# Patient Record
Sex: Female | Born: 1990 | Race: White | Hispanic: Yes | Marital: Single | Smoking: Never smoker
Health system: Southern US, Community
[De-identification: ages and names within clinical notes are randomized; demographics above are authoritative.]

## PROBLEM LIST (undated history)

## (undated) DIAGNOSIS — Z789 Other specified health status: Secondary | ICD-10-CM

## (undated) HISTORY — DX: Other specified health status: Z78.9

## (undated) HISTORY — PX: NO PAST SURGERIES: SHX2092

---
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
## 2011-03-08 NOTE — Op Note (Signed)
NAMELINZEE, DEPAUL NO.:  [id number]   MEDICAL RECORD NO.:  [id number]          PATIENT TYPE:  AMB   LOCATION:  DSC                          FACILITY:  [facility]   PHYSICIAN:  Bunnie Pion, MD   DATE OF BIRTH:  08/21/1991   DATE OF PROCEDURE:  DATE OF DISCHARGE:                               OPERATIVE REPORT   PREOPERATIVE DIAGNOSIS:  Foreign body in posterior scalp.   POSTOPERATIVE DIAGNOSIS:  Foreign body in posterior scalp.   OPERATION PERFORMED:  Retrieval of foreign body in scalp with  fluoroscopic guidance.   ATTENDING SURGEON:  Cyd Silence, MD   ASSISTANT SURGEON:  Karie Soda, MD   ANESTHESIA:  General endotracheal.   ESTIMATED BLOOD LOSS:  Minimal.   FINDINGS:  1. Deeply embedded sewing needle in the periosteum of the posterior      scalp.  2. No evidence of infection.  3. No evidence of erosion into the bone.   DESCRIPTION OF PROCEDURE:  Patient was placed in the supine position  upon the operating room table.  When adequate level of anesthesia had  been safely obtained, the patient was rolled to a prone position,  carefully padded and secured.  Using the fluoroscopy machine, the  retained foreign body, which had been present for 7 or 8 years  supposedly, was localized.  The hair was shaved in this area for  approximately a half dollar size diameter.  This was prepped and draped.   Fluoroscopy was again used to pinpoint the area of concern.  A  transverse incision was made perpendicular to where the anticipated  foreign body was.  Fluoroscopy was used over the next several minutes to  further pinpoint the foreign body.  This was discovered deep within the  scalp adjacent to the bone.  There was no significant inflammation or  infection.  The needle was removed in two pieces and passed off the  field.   Repeat fluoroscopy demonstrated there was no retained foreign body.  The  incision was closed with deep interrupted nylon sutures.   Marcaine was  injected.  Patient was awakened in the operating room and returned to  recovery room in stable condition.      Bunnie Pion, MD  Electronically Signed     TMW/MEDQ  D:  06/11/2007  T:  06/11/2007  Job:  [id number]

## 2011-07-21 LAB — URINALYSIS, ROUTINE W REFLEX MICROSCOPIC
Bilirubin Urine: NEGATIVE
Hgb urine dipstick: NEGATIVE
Nitrite: NEGATIVE
Protein, ur: NEGATIVE
Specific Gravity, Urine: 1.02
Urobilinogen, UA: 0.2

## 2011-07-26 LAB — COMPREHENSIVE METABOLIC PANEL
AST: 20
Albumin: 2.7 — ABNORMAL LOW
BUN: 4 — ABNORMAL LOW
Calcium: 8.8
Creatinine, Ser: 0.48
Total Bilirubin: 0.6
Total Protein: 5.5 — ABNORMAL LOW

## 2011-07-26 LAB — URINALYSIS, DIPSTICK ONLY
Bilirubin Urine: NEGATIVE
Glucose, UA: NEGATIVE
Hgb urine dipstick: NEGATIVE
Ketones, ur: NEGATIVE
Nitrite: NEGATIVE
Specific Gravity, Urine: <1.005 — ABNORMAL LOW
pH: 7

## 2011-07-26 LAB — CBC
HCT: 25.9 — ABNORMAL LOW
Hemoglobin: 9 — ABNORMAL LOW
MCHC: 34.7
MCHC: 35.5
MCV: 92.8
Platelets: 250
RBC: 2.76 — ABNORMAL LOW
RBC: 3.11 — ABNORMAL LOW
RDW: 14.6

## 2011-07-26 LAB — RPR: RPR Ser Ql: NONREACTIVE

## 2011-08-05 LAB — POCT HEMOGLOBIN-HEMACUE: Hemoglobin: 14.1

## 2013-03-21 ENCOUNTER — Encounter (HOSPITAL_COMMUNITY): Payer: Self-pay | Admitting: *Deleted

## 2013-03-21 ENCOUNTER — Emergency Department (HOSPITAL_COMMUNITY)
Admission: EM | Admit: 2013-03-21 | Discharge: 2013-03-21 | Disposition: A | Discharge status: Home / Self Care | Payer: Self-pay | Attending: Emergency Medicine | Admitting: Emergency Medicine

## 2013-03-21 DIAGNOSIS — L03211 Cellulitis of face: Secondary | ICD-10-CM | POA: Insufficient documentation

## 2013-03-21 DIAGNOSIS — Z79899 Other long term (current) drug therapy: Secondary | ICD-10-CM | POA: Insufficient documentation

## 2013-03-21 DIAGNOSIS — L708 Other acne: Secondary | ICD-10-CM | POA: Insufficient documentation

## 2013-03-21 DIAGNOSIS — L709 Acne, unspecified: Secondary | ICD-10-CM

## 2013-03-21 DIAGNOSIS — Z872 Personal history of diseases of the skin and subcutaneous tissue: Secondary | ICD-10-CM | POA: Insufficient documentation

## 2013-03-21 DIAGNOSIS — L089 Local infection of the skin and subcutaneous tissue, unspecified: Secondary | ICD-10-CM

## 2013-03-21 DIAGNOSIS — R51 Headache: Secondary | ICD-10-CM | POA: Insufficient documentation

## 2013-03-21 DIAGNOSIS — L0201 Cutaneous abscess of face: Secondary | ICD-10-CM | POA: Insufficient documentation

## 2013-03-21 MED ORDER — CEPHALEXIN 250 MG PO CAPS: 500.0000 mg | ORAL_CAPSULE | Freq: Two times a day (BID) | ORAL | Status: DC

## 2013-03-21 NOTE — ED Provider Notes (Signed)
History  This chart was scribed for non-physician practitioner, Hector Shade, working with Dione Booze, MD by Ardeen Jourdain, ED Scribe. This patient was seen in room TR09C/TR09C and the patient's care was started at 2215.  CSN: [id number]  Arrival date & time 03/21/13  2000   First MD Initiated Contact with Patient 03/21/13 2215      Chief Complaint  Patient presents with  . Abscess     The history is provided by the patient. No language interpreter was used.    HPI Comments: Jeanette Becker is a 22 y.o. female who presents to the Emergency Department complaining of gradual onset, gradually worsening, constant abscesses to her face with associated pain. She states the areas began to worsen yesterday. She reports using Neosporin with out relief. She states she has a h/o similar abscesses to her left arm and face. She denies any fever, abdominal pain, eye pain, nausea and emesis as associated symptoms.    History reviewed. No pertinent past medical history.  History reviewed. No pertinent past surgical history.  History reviewed. No pertinent family history.  History  Substance Use Topics  . Smoking status: Never Smoker   . Smokeless tobacco: Not on file  . Alcohol Use: No    Review of Systems  Skin:       Abscesses to face  All other systems reviewed and are negative.    Allergies  Review of patient's allergies indicates no known allergies.  Home Medications   Current Outpatient Rx  Name  Route  Sig  Dispense  Refill  . Etonogestrel (IMPLANON [place])   Subcutaneous   Inject 1 application into the skin. Implanted May 2013         . cephALEXin (KEFLEX) 250 MG capsule   Oral   Take 2 capsules (500 mg total) by mouth 2 (two) times daily.   20 capsule   0     Triage Vitals: BP 114/64  Pulse 92  Temp(Src) 98.3 F (36.8 C)  Resp 18  SpO2 98%  Physical Exam  Nursing note and vitals reviewed. Constitutional: She is oriented to person, place, and  time. She appears well-developed and well-nourished. No distress.  HENT:  Head: Normocephalic and atraumatic.  Mouth/Throat: Oropharynx is clear and moist. No oropharyngeal exudate.  Eyes: EOM are normal. Pupils are equal, round, and reactive to light.  Neck: Normal range of motion. Neck supple. No tracheal deviation present.  Cardiovascular: Normal rate, regular rhythm and normal heart sounds.   Pulmonary/Chest: Effort normal and breath sounds normal. No respiratory distress.  Abdominal: Soft. She exhibits no distension.  Musculoskeletal: Normal range of motion. She exhibits no edema.  Neurological: She is alert and oriented to person, place, and time.  Skin: Skin is warm and dry. She is not diaphoretic.  Multiple pustular skin lesions over entire face. 6 circular erythematic draining lesions with white/yellow centers  on left and right side of face. Minimal clear drainage. Mild tenderness.   Psychiatric: She has a normal mood and affect. Her behavior is normal.    ED Course  Procedures (including critical care time)  DIAGNOSTIC STUDIES: Oxygen Saturation is 98% on room air, normal by my interpretation.    COORDINATION OF CARE:  10:35 PM-Discussed treatment plan which includes antibiotics and follow up with a dermatologist with pt at bedside and pt agreed to plan.    Labs Reviewed - No data to display No results found.   1. Acne   2. Skin infection  MDM  Pt has chronic hx of severe facial acne, presented with infected lesions on face.  Pt denies fever, nasal congestion, sore throat, or ear pain.  Lesions on face are draining scant clear fluid.  Believe pt needs further evaluation and treatment for chronic severe acne by a dermatologist.  Will attempt to treat current skin infection with a round of antibiotics.   Rx: Keflex.  F/u with [facility] urgent care or try making an appointment with [facility] next week for further treatment and evaluation for acne and skin  infection.  Return to ED if increased redness, pain, fever, n/v/d.   I personally performed the services described in this documentation, which was scribed in my presence. The recorded information has been reviewed and is accurate.    Junius Finner, PA-C 03/22/13 2054

## 2013-03-21 NOTE — ED Notes (Signed)
Pt has 3 abscesses on right side of face. Pt right side of face and neck swollen and tender to touch. Airway intact. No SOB.
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
## 2013-03-23 NOTE — ED Provider Notes (Signed)
Medical screening examination/treatment/procedure(s) were performed by non-physician practitioner and as supervising physician I was immediately available for consultation/collaboration.  Amor Packard, MD 03/23/13 0036 

## 2015-02-02 ENCOUNTER — Ambulatory Visit (INDEPENDENT_AMBULATORY_CARE_PROVIDER_SITE_OTHER): Payer: Self-pay | Admitting: Family Medicine

## 2015-02-02 VITALS — BP 102/68 | HR 67 | Temp 97.9°F | Resp 18 | Ht 61.5 in | Wt 167.0 lb

## 2015-02-02 DIAGNOSIS — N912 Amenorrhea, unspecified: Secondary | ICD-10-CM

## 2015-02-02 LAB — POCT URINE PREGNANCY: PREG TEST UR: NEGATIVE

## 2015-02-02 NOTE — Progress Notes (Signed)
This is a 24 year old woman who is worried that she might be pregnant. She passed some tissue one week ago. She's had no subsequent bleeding, fever, abdominal pain.  Patient had been using Nexplanon contraception until 3 months ago.  Objective: No acute distress  Patient showed me what tissue she passed last week which was in a regular room semisolid 1 cm piece of tissue  This chart was scribed in my presence and reviewed by me personally.    ICD-9-CM ICD-10-CM   1. Absent menses 626.0 N91.2 POCT urine pregnancy   It may have been an early pregnancy but I think it more likely that patient just passed some clotted menstrual tissue.  Signed, Elvina SidleKurt Torre Pikus, MD

## 2015-02-02 NOTE — Patient Instructions (Signed)
I want you to use Afrin nasal spray (oxymetazoline) twice a day for 3 days. We're going to refer you to a ear nose and throat doctor. I also want you to take omeprazole for one month for the reflux   Diverticulitis Diverticulitis is inflammation or infection of small pouches in your colon that form when you have a condition called diverticulosis. The pouches in your colon are called diverticula. Your colon, or large intestine, is where water is absorbed and stool is formed. Complications of diverticulitis can include:  Bleeding.  Severe infection.  Severe pain.  Perforation of your colon.  Obstruction of your colon. CAUSES  Diverticulitis is caused by bacteria. Diverticulitis happens when stool becomes trapped in diverticula. This allows bacteria to grow in the diverticula, which can lead to inflammation and infection. RISK FACTORS People with diverticulosis are at risk for diverticulitis. Eating a diet that does not include enough fiber from fruits and vegetables may make diverticulitis more likely to develop. SYMPTOMS  Symptoms of diverticulitis may include:  Abdominal pain and tenderness. The pain is normally located on the left side of the abdomen, but may occur in other areas.  Fever and chills.  Bloating.  Cramping.  Nausea.  Vomiting.  Constipation.  Diarrhea.  Blood in your stool. DIAGNOSIS  Your health care provider will ask you about your medical history and do a physical exam. You may need to have tests done because many medical conditions can cause the same symptoms as diverticulitis. Tests may include:  Blood tests.  Urine tests.  Imaging tests of the abdomen, including X-rays and CT scans. When your condition is under control, your health care provider may recommend that you have a colonoscopy. A colonoscopy can show how severe your diverticula are and whether something else is causing your symptoms. TREATMENT  Most cases of diverticulitis are mild  and can be treated at home. Treatment may include:  Taking over-the-counter pain medicines.  Following a clear liquid diet.  Taking antibiotic medicines by mouth for 7-10 days. More severe cases may be treated at a hospital. Treatment may include:  Not eating or drinking.  Taking prescription pain medicine.  Receiving antibiotic medicines through an IV tube.  Receiving fluids and nutrition through an IV tube.  Surgery. HOME CARE INSTRUCTIONS   Follow your health care provider's instructions carefully.  Follow a full liquid diet or other diet as directed by your health care provider. After your symptoms improve, your health care provider may tell you to change your diet. He or she may recommend you eat a high-fiber diet. Fruits and vegetables are good sources of fiber. Fiber makes it easier to pass stool.  Take fiber supplements or probiotics as directed by your health care provider.  Only take medicines as directed by your health care provider.  Keep all your follow-up appointments. SEEK MEDICAL CARE IF:   Your pain does not improve.  You have a hard time eating food.  Your bowel movements do not return to normal. SEEK IMMEDIATE MEDICAL CARE IF:   Your pain becomes worse.  Your symptoms do not get better.  Your symptoms suddenly get worse.  You have a fever.  You have repeated vomiting.  You have bloody or black, tarry stools. MAKE SURE YOU:   Understand these instructions.  Will watch your condition.  Will get help right away if you are not doing well or get worse. Document Released: 07/20/2005 Document Revised: 10/15/2013 Document Reviewed: 09/04/2013 [facility] Patient Information 2015 [place], [place]. This  information is not intended to replace advice given to you by your health care provider. Make sure you discuss any questions you have with your health care provider.  

## 2016-10-23 ENCOUNTER — Ambulatory Visit (HOSPITAL_COMMUNITY)
Admission: EM | Admit: 2016-10-23 | Discharge: 2016-10-23 | Disposition: A | Discharge status: Home / Self Care | Payer: No Typology Code available for payment source | Source: Ambulatory Visit | Attending: Emergency Medicine | Admitting: Emergency Medicine

## 2016-10-23 ENCOUNTER — Emergency Department (HOSPITAL_COMMUNITY)
Admission: EM | Admit: 2016-10-23 | Discharge: 2016-10-23 | Disposition: A | Discharge status: Home / Self Care | Payer: Self-pay | Attending: Emergency Medicine | Admitting: Emergency Medicine

## 2016-10-23 ENCOUNTER — Encounter (HOSPITAL_COMMUNITY): Payer: Self-pay | Admitting: Emergency Medicine

## 2016-10-23 DIAGNOSIS — Z0441 Encounter for examination and observation following alleged adult rape: Secondary | ICD-10-CM | POA: Insufficient documentation

## 2016-10-23 DIAGNOSIS — T7491XA Unspecified adult maltreatment, confirmed, initial encounter: Secondary | ICD-10-CM | POA: Insufficient documentation

## 2016-10-23 DIAGNOSIS — Z79899 Other long term (current) drug therapy: Secondary | ICD-10-CM | POA: Insufficient documentation

## 2016-10-23 DIAGNOSIS — T7421XA Adult sexual abuse, confirmed, initial encounter: Secondary | ICD-10-CM
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
## 2016-10-23 NOTE — ED Notes (Signed)
Pt discharged with SANE RN Amy
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
## 2018-01-04 DIAGNOSIS — R87612 Low grade squamous intraepithelial lesion on cytologic smear of cervix (LGSIL): Secondary | ICD-10-CM | POA: Insufficient documentation

## 2019-10-25 NOTE — L&D Delivery Note (Signed)
Delivery Note At 1:49 AM a viable and healthy female was delivered via Vaginal, Spontaneous (Presentation: Right Occiput Anterior).  APGAR: 8, 9; weight pending.   Placenta status: Spontaneous, Intact.  Cord: 3 vessels with the following complications: None.    Anesthesia: Epidural Episiotomy: None Lacerations: None Suture Repair: n/a Est. Blood Loss (mL): 50  Mom to postpartum.  Baby to Couplet care / Skin to Skin.  Jeanette Becker is a 29 y.o. female G2P1001 with IUP at [redacted]w[redacted]d admitted for SROM .  She progressed with augmentation to complete and pushed less than 10 minutes to deliver.  Cord clamping delayed by 1-3 minutes then clamped by CNM and cut by FOB.  Placenta intact and spontaneous, bleeding minimal.  Intact perineum with no repair.  Mom and baby stable prior to transfer to postpartum. She plans on breastfeeding. She requests contraceptive patch/POPs intially  for birth control.    [name] 06/28/2020, 2:49 AM

## 2019-12-09 ENCOUNTER — Ambulatory Visit: Payer: Medicaid Other

## 2019-12-09 DIAGNOSIS — Z3480 Encounter for supervision of other normal pregnancy, unspecified trimester: Secondary | ICD-10-CM | POA: Insufficient documentation

## 2019-12-09 MED ORDER — BLOOD PRESSURE KIT DEVI: 1.0000 | 0 refills | Status: DC | PRN

## 2019-12-09 NOTE — Progress Notes (Signed)
Patient seen and assessed by nursing staff during this encounter. I have reviewed the chart and agree with the documentation and plan.  Catalina Antigua, MD 12/09/2019 4:11 PM

## 2019-12-09 NOTE — Progress Notes (Signed)
..    Virtual Visit via Telephone Note  I connected with Jeanette Becker on 12/09/19 at 10:30 AM EST by telephone and verified that I am speaking with the correct person using two identifiers.  Location: Patient: Jeanette Becker   I discussed the limitations, risks, security and privacy concerns of performing an evaluation and management service by telephone and the availability of in person appointments. I also discussed with the patient that there may be a patient responsible charge related to this service. The patient expressed understanding and agreed to proceed.   History of Present Illness: PRENATAL INTAKE SUMMARY  Ms. Jeanette Becker presents today New OB Nurse Interview.  OB History    Gravida  2   Para  1   Term  1   Preterm      AB      Living  1     SAB      TAB      Ectopic      Multiple      Live Births  1          I have reviewed the patient's medical, obstetrical, social, and family histories, medications, and available lab results.  SUBJECTIVE She has no unusual complaints   Observations/Objective: Initial nurse interview for history/labs (New OB)  EDD: 07-03-20  GA: [redacted]w[redacted]d GP: G2P1  GENERAL APPEARANCE: alert, well appearing  Assessment and Plan: Normal pregnancy Pt states that 2 years ago she had a Mirena IUD and was sent to get U/S because strings were not seen. Pt never went because she could not afford the U/S, pt does not know status of IUD today. Advised that issue will be evaluated and discussed at appt next week, pt agreed. [facility].  Follow Up Instructions:   I discussed the assessment and treatment plan with the patient. The patient was provided an opportunity to ask questions and all were answered. The patient agreed with the plan and demonstrated an understanding of the instructions.   The patient was advised to call back or seek an in-person evaluation if the symptoms worsen or if the  condition fails to improve as anticipated.  I provided 15 minutes of non-face-to-face time during this encounter.   Katrina Stack, RN

## 2019-12-16 ENCOUNTER — Ambulatory Visit (INDEPENDENT_AMBULATORY_CARE_PROVIDER_SITE_OTHER): Payer: Medicaid Other | Admitting: Nurse Practitioner

## 2019-12-16 ENCOUNTER — Other Ambulatory Visit: Payer: Self-pay

## 2019-12-16 ENCOUNTER — Encounter: Payer: Self-pay | Admitting: Nurse Practitioner

## 2019-12-16 ENCOUNTER — Other Ambulatory Visit (HOSPITAL_COMMUNITY)
Admission: RE | Admit: 2019-12-16 | Discharge: 2019-12-16 | Disposition: A | Discharge status: Home / Self Care | Payer: Medicaid Other | Source: Ambulatory Visit | Attending: Nurse Practitioner | Admitting: Nurse Practitioner

## 2019-12-16 VITALS — Wt 149.0 lb

## 2019-12-16 DIAGNOSIS — Z3480 Encounter for supervision of other normal pregnancy, unspecified trimester: Secondary | ICD-10-CM

## 2019-12-16 DIAGNOSIS — R87612 Low grade squamous intraepithelial lesion on cytologic smear of cervix (LGSIL): Secondary | ICD-10-CM

## 2019-12-16 DIAGNOSIS — T8331XA Breakdown (mechanical) of intrauterine contraceptive device, initial encounter: Secondary | ICD-10-CM

## 2019-12-16 DIAGNOSIS — Z3A11 11 weeks gestation of pregnancy: Secondary | ICD-10-CM

## 2019-12-16 DIAGNOSIS — O2631 Retained intrauterine contraceptive device in pregnancy, first trimester: Secondary | ICD-10-CM

## 2019-12-16 DIAGNOSIS — Z3481 Encounter for supervision of other normal pregnancy, first trimester: Secondary | ICD-10-CM | POA: Diagnosis not present

## 2019-12-16 MED ORDER — VITAFOL GUMMIES 3.33-0.333-34.8 MG PO CHEW: 3.0000 | CHEWABLE_TABLET | Freq: Every day | ORAL | 11 refills | Status: DC

## 2019-12-16 NOTE — Progress Notes (Signed)
Subjective:   Jeanette Becker is a 29 y.o. G2P1001 at 23w3dby LMP being seen today for her first obstetrical visit.  Her obstetrical history is significant for previous IUD strings not seen and unsure if it is still in uterus - never had [place] check location of IUD. Patient does intend to breast feed. Pregnancy history fully reviewed.  Patient reports no complaints.  HISTORY: OB History  Gravida Para Term Preterm AB Living  _0 0 0 1  SAB TAB Ectopic Multiple Live Births  0 0 0 0 1    # Outcome Date GA Lbr Len/2nd Weight Sex Delivery Anes PTL Lv  2 Current           1 Term 09/19/08     Vag-Spont   LIV   History reviewed. No pertinent past medical history. History reviewed. No pertinent surgical history. History reviewed. No pertinent family history. Social History   Tobacco Use  . Smoking status: Never Smoker  . Smokeless tobacco: Never Used  Substance Use Topics  . Alcohol use: No  . Drug use: No   No Known Allergies Current Outpatient Medications on File Prior to Visit  Medication Sig Dispense Refill  . Blood Pressure Monitoring (BLOOD PRESSURE KIT) DEVI 1 kit by Does not apply route as needed. 1 each 0  . Prenatal Vit-Fe Fumarate-FA (PRENATAL MULTIVITAMIN) TABS tablet Take 1 tablet by mouth daily at 12 noon.    .Marland Kitchenlevonorgestrel (MIRENA) 20 MCG/24HR IUD by Intrauterine route.    . Multiple Vitamin (MULTIVITAMIN) tablet Take 1 tablet by mouth daily.     No current facility-administered medications on file prior to visit.     Exam   Vitals:   12/16/19 1430  Weight: 149 lb (67.6 kg)   Fetal Heart Rate (bpm): 156  Uterus:  Fundal Height: 11 cm  Pelvic Exam: Perineum: no hemorrhoids, normal perineum   Vulva: normal external genitalia, no lesions   Vagina:  normal mucosa, normal discharge   Cervix: no lesions and normal, pap smear not done, no IUD strings seen, looked carefully in cervical os - shadow but no IUD strings, very small amount of bleeding  from external os due to  manipulation of cervix    Adnexa: Bimanual omitted   Bony Pelvis: average  System: General: well-developed, well-nourished female in no acute distress   Breast:  deferred   Skin: normal coloration and turgor, no rashes   Neurologic: oriented, normal, negative, normal mood   Extremities: normal strength, tone, and muscle mass, ROM of all joints is normal   HEENT extraocular movement intact and sclera clear, anicteric   Mouth/Teeth deferred   Neck supple and no masses, normal thyroid   Cardiovascular: regular rate and rhythm   Respiratory:  no respiratory distress, normal breath sounds   Abdomen: soft, non-tender; no masses,  no organomegaly     Assessment:   Pregnancy: G2P1001 Patient Active Problem List   Diagnosis Date Noted  . Supervision of other normal pregnancy, antepartum 12/09/2019  . LGSIL on Pap smear of cervix 01/04/2018     Plan:  1. Supervision of other normal pregnancy, antepartum Reports [facility] and ROI completed to obtain pap smear result Discussed BabyScripts and MyChart Advised online childbirth and breastfeeding classes as she does not want epidural anesthesia for this birth.  - Obstetric Panel, Including HIV - Culture, OB Urine - Genetic Screening - Urine cytology ancillary only([place]) - Enroll Patient in  Babyscripts - Babyscripts Schedule Optimization - US OB Comp Less 14 Wks; Future  2. IUD pregnancy No strings seen, client was offered an ultrasound previously to check location of IUD but did not have US done.  Will get early US at first available appointment.  3. LGSIL on Pap smear of cervix Earlier pap result.  Reports she had no phone call about the results of her last pap which would indicate a normal result but will request copy of pap result.   Initial labs drawn. Continue prenatal vitamins. Genetic Screening discussed, NIPS: ordered. Ultrasound discussed; fetal anatomic survey:  discussed by not ordered.  will order early US to see if IUD is in uterus. Problem list reviewed and updated. The nature of [facility] with multiple MDs and other Advanced Practice Providers was explained to patient; also emphasized that residents, students are part of our team. Routine obstetric precautions reviewed. Return in about 4 weeks (around 01/13/2020) for virtual ROB.  Total face-to-face time with patient: 40 minutes.  Over 50% of encounter was spent on counseling and coordination of care.     TERRI BURLESON, FNP Family Nurse Practitioner, [facility]  [facility] 12/16/2019 8:31 PM   

## 2019-12-16 NOTE — Progress Notes (Signed)
Pt is here for initial OB visit, LMP 09/27/19, EDD 07/03/20. Pt reports last pap was in September at another office, pt thinks it was normal.

## 2019-12-17 LAB — OBSTETRIC PANEL, INCLUDING HIV
Antibody Screen: NEGATIVE
Basophils Absolute: 0.1 10*3/uL (ref 0.0–0.2)
Basos: 1 %
EOS (ABSOLUTE): 0.3 10*3/uL (ref 0.0–0.4)
Eos: 4 %
HIV Screen 4th Generation wRfx: NONREACTIVE
Hematocrit: 38.3 % (ref 34.0–46.6)
Hemoglobin: 13 g/dL (ref 11.1–15.9)
Hepatitis B Surface Ag: NEGATIVE
Immature Grans (Abs): 0.1 10*3/uL (ref 0.0–0.1)
Immature Granulocytes: 1 %
Lymphocytes Absolute: 1.8 10*3/uL (ref 0.7–3.1)
Lymphs: 22 %
MCH: 31.2 pg (ref 26.6–33.0)
MCHC: 33.9 g/dL (ref 31.5–35.7)
MCV: 92 fL (ref 79–97)
Monocytes Absolute: 0.4 10*3/uL (ref 0.1–0.9)
Monocytes: 6 %
Neutrophils Absolute: 5.3 10*3/uL (ref 1.4–7.0)
Neutrophils: 66 %
Platelets: 392 10*3/uL (ref 150–450)
RBC: 4.17 x10E6/uL (ref 3.77–5.28)
RDW: 12 % (ref 11.7–15.4)
RPR Ser Ql: NONREACTIVE
Rh Factor: POSITIVE
Rubella Antibodies, IGG: 3.07 index (ref >0.99)
WBC: 8 10*3/uL (ref 3.4–10.8)

## 2019-12-18 LAB — URINE CYTOLOGY ANCILLARY ONLY
Chlamydia: POSITIVE — AB
Comment: NEGATIVE
Comment: NORMAL
Neisseria Gonorrhea: NEGATIVE

## 2019-12-19 ENCOUNTER — Telehealth: Payer: Medicaid Other | Admitting: Obstetrics & Gynecology

## 2019-12-19 ENCOUNTER — Ambulatory Visit (HOSPITAL_COMMUNITY)
Admission: RE | Admit: 2019-12-19 | Discharge: 2019-12-19 | Disposition: A | Discharge status: Home / Self Care | Payer: Medicaid Other | Source: Ambulatory Visit | Attending: Nurse Practitioner | Admitting: Nurse Practitioner

## 2019-12-19 ENCOUNTER — Other Ambulatory Visit: Payer: Self-pay

## 2019-12-19 DIAGNOSIS — Z3480 Encounter for supervision of other normal pregnancy, unspecified trimester: Secondary | ICD-10-CM | POA: Diagnosis present

## 2019-12-19 LAB — CULTURE, OB URINE

## 2019-12-19 LAB — URINE CULTURE, OB REFLEX

## 2019-12-20 ENCOUNTER — Other Ambulatory Visit: Payer: Self-pay

## 2019-12-20 MED ORDER — AZITHROMYCIN 500 MG PO TABS: 1000.0000 mg | ORAL_TABLET | Freq: Once | ORAL | 1 refills | Status: AC

## 2019-12-20 NOTE — Progress Notes (Signed)
Notified pt of pos. Chlamydia result and sent rx for treatment to her preferred pharmacy with one refill for partner per protocol. Advised pt to refrain from IC for one week after both parties have been treated. Advised pt we will retest her to make sure infection is gone. Pt voices understanding. Health Dept notification faxed.

## 2019-12-24 ENCOUNTER — Encounter: Payer: Self-pay | Admitting: Nurse Practitioner

## 2019-12-27 ENCOUNTER — Encounter: Payer: Self-pay | Admitting: Nurse Practitioner

## 2020-01-07 ENCOUNTER — Other Ambulatory Visit: Payer: Self-pay

## 2020-01-07 ENCOUNTER — Telehealth: Payer: Self-pay

## 2020-01-07 DIAGNOSIS — O219 Vomiting of pregnancy, unspecified: Secondary | ICD-10-CM

## 2020-01-07 MED ORDER — PROMETHAZINE HCL 25 MG PO TABS: 25.0000 mg | ORAL_TABLET | Freq: Four times a day (QID) | ORAL | 0 refills | Status: DC | PRN

## 2020-01-07 NOTE — Progress Notes (Signed)
Rx sent per protocol For Nausea message will be sent to provider regarding HA's.

## 2020-01-07 NOTE — Telephone Encounter (Signed)
Return call to pt  Pt not ava  LVOM

## 2020-01-13 ENCOUNTER — Telehealth (INDEPENDENT_AMBULATORY_CARE_PROVIDER_SITE_OTHER): Payer: Medicaid Other | Admitting: Advanced Practice Midwife

## 2020-01-13 VITALS — BP 99/53 | HR 88

## 2020-01-13 DIAGNOSIS — O98811 Other maternal infectious and parasitic diseases complicating pregnancy, first trimester: Secondary | ICD-10-CM | POA: Diagnosis not present

## 2020-01-13 DIAGNOSIS — R519 Headache, unspecified: Secondary | ICD-10-CM

## 2020-01-13 DIAGNOSIS — A749 Chlamydial infection, unspecified: Secondary | ICD-10-CM

## 2020-01-13 DIAGNOSIS — O26892 Other specified pregnancy related conditions, second trimester: Secondary | ICD-10-CM | POA: Diagnosis not present

## 2020-01-13 DIAGNOSIS — Z3A19 19 weeks gestation of pregnancy: Secondary | ICD-10-CM

## 2020-01-13 MED ORDER — BUTALBITAL-APAP-CAFFEINE 50-325-40 MG PO TABS: 1.0000 | ORAL_TABLET | Freq: Four times a day (QID) | ORAL | 0 refills | Status: DC | PRN

## 2020-01-13 NOTE — Progress Notes (Signed)
   TELEHEALTH VIRTUAL OBSTETRICS VISIT ENCOUNTER NOTE  I connected with Jeanette Becker on 01/13/20 at 10:55 AM EDT by telephone at home and verified that I am speaking with the correct person using two identifiers.   I discussed the limitations, risks, security and privacy concerns of performing an evaluation and management service by telephone and the availability of in person appointments. I also discussed with the patient that there may be a patient responsible charge related to this service. The patient expressed understanding and agreed to proceed.  Subjective:  Jeanette Becker is a 29 y.o. G2P1001 at [redacted]w[redacted]d being followed for ongoing prenatal care.  She is currently monitored for the following issues for this low-risk pregnancy and has Supervision of other normal pregnancy, antepartum and LGSIL on Pap smear of cervix on their problem list.  Patient reports headache. Reports fetal movement. Denies any contractions, bleeding or leaking of fluid.   The following portions of the patient's history were reviewed and updated as appropriate: allergies, current medications, past family history, past medical history, past social history, past surgical history and problem list.   Objective:   General:  Alert, oriented and cooperative.   Mental Status: Normal mood and affect perceived. Normal judgment and thought content.  Rest of physical exam deferred due to type of encounter  Assessment and Plan:  Pregnancy: G2P1001 at [redacted]w[redacted]d 1. Supervision of other normal pregnancy, antepartum --Pt  denies cramping, LOF, or vaginal bleeding --Needs AFP and TOC chlamydia, offered lab only visit vs in person visit in 2 weeks --In person visit in 2 weeks, the virtual in 6 weeks  - AFP, Serum, Open Spina Bifida; Future - [place] MFM OB COMP + 14 WK; Future  2. Chlamydia infection affecting pregnancy in first trimester --Treated for chlamydia 2/26.  Needs TOC at next visit. - Cervicovaginal ancillary  only( [place]); Future  3. Headache in pregnancy, antepartum, second trimester --Daily h/a, improved but not resolved by Tylenol. --Pt drinking 64-80 oz water daily. Reports some n/v so may not be getting enough calories. --Encouraged increased calories, especially protiein.  --Try Fioricet PRN if not improved.  --F/U in 2 weeks - butalbital-acetaminophen-caffeine (FIORICET) 50-325-40 MG tablet; Take 1-2 tablets by mouth every 6 (six) hours as needed for headache.  Dispense: 20 tablet; Refill: 0  Preterm labor symptoms and general obstetric precautions including but not limited to vaginal bleeding, contractions, leaking of fluid and fetal movement were reviewed in detail with the patient.  I discussed the assessment and treatment plan with the patient. The patient was provided an opportunity to ask questions and all were answered. The patient agreed with the plan and demonstrated an understanding of the instructions. The patient was advised to call back or seek an in-person office evaluation/go to MAU at [facility] for any urgent or concerning symptoms. Please refer to After Visit Summary for other counseling recommendations.   I provided 10 minutes of non-face-to-face time during this encounter.  No follow-ups on file.  No future appointments.  [name], CNM [facility] [employer], [facility]

## 2020-01-13 NOTE — Progress Notes (Signed)
S/w pt for virtual visit, not feeling fetal movement yet, denies pain.

## 2020-01-27 ENCOUNTER — Other Ambulatory Visit: Payer: Self-pay

## 2020-01-27 ENCOUNTER — Other Ambulatory Visit (HOSPITAL_COMMUNITY)
Admission: RE | Admit: 2020-01-27 | Discharge: 2020-01-27 | Disposition: A | Discharge status: Home / Self Care | Payer: Medicaid Other | Source: Ambulatory Visit | Attending: Advanced Practice Midwife | Admitting: Advanced Practice Midwife

## 2020-01-27 ENCOUNTER — Ambulatory Visit (INDEPENDENT_AMBULATORY_CARE_PROVIDER_SITE_OTHER): Payer: Medicaid Other | Admitting: Advanced Practice Midwife

## 2020-01-27 VITALS — BP 100/61 | HR 65 | Wt 148.8 lb

## 2020-01-27 DIAGNOSIS — O98811 Other maternal infectious and parasitic diseases complicating pregnancy, first trimester: Secondary | ICD-10-CM | POA: Insufficient documentation

## 2020-01-27 DIAGNOSIS — Z3480 Encounter for supervision of other normal pregnancy, unspecified trimester: Secondary | ICD-10-CM

## 2020-01-27 DIAGNOSIS — R12 Heartburn: Secondary | ICD-10-CM

## 2020-01-27 DIAGNOSIS — O26892 Other specified pregnancy related conditions, second trimester: Secondary | ICD-10-CM

## 2020-01-27 DIAGNOSIS — Z3A17 17 weeks gestation of pregnancy: Secondary | ICD-10-CM

## 2020-01-27 DIAGNOSIS — O98312 Other infections with a predominantly sexual mode of transmission complicating pregnancy, second trimester: Secondary | ICD-10-CM

## 2020-01-27 DIAGNOSIS — A749 Chlamydial infection, unspecified: Secondary | ICD-10-CM | POA: Insufficient documentation

## 2020-01-27 MED ORDER — FAMOTIDINE 20 MG PO TABS: 20.0000 mg | ORAL_TABLET | Freq: Two times a day (BID) | ORAL | 2 refills | Status: DC

## 2020-01-27 NOTE — Patient Instructions (Signed)
Heartburn During Pregnancy Heartburn is a type of pain or discomfort that can happen in the throat or chest. It is often described as a burning sensation. Heartburn is common during pregnancy because:  A hormone (progesterone) that is released during pregnancy may relax the valve (lower esophageal sphincter, or LES) that separates the esophagus from the stomach. This allows stomach acid to move up into the esophagus, causing heartburn.  The uterus gets larger and pushes up on the stomach, which pushes more acid into the esophagus. This is especially true in the later stages of pregnancy. Heartburn usually goes away or gets better after giving birth. What are the causes? Heartburn is caused by stomach acid backing up into the esophagus (reflux). Reflux can be triggered by:  Changing hormone levels.  Large meals.  Certain foods and beverages, such as coffee, chocolate, onions, and peppermint.  Exercise.  Increased stomach acid production. What increases the risk? You are more likely to experience heartburn during pregnancy if you:  Had heartburn prior to becoming pregnant.  Have been pregnant more than once before.  Are overweight or obese. The likelihood that you will get heartburn also increases as you get farther along in your pregnancy, especially during the last trimester. What are the signs or symptoms? Symptoms of this condition include:  Burning pain in the chest or lower throat.  Bitter taste in the mouth.  Coughing.  Problems swallowing.  Vomiting.  Hoarse voice.  Asthma. Symptoms may get worse when you lie down or bend over. Symptoms are often worse at night. How is this diagnosed? This condition is diagnosed based on:  Your medical history.  Your symptoms.  Blood tests to check for a certain type of bacteria associated with heartburn.  Whether taking heartburn medicine relieves your symptoms.  Examination of the stomach and esophagus using a tube with  a light and camera on the end (endoscopy). How is this treated? Treatment varies depending on how severe your symptoms are. Your health care provider may recommend:  Over-the-counter medicines (antacids or acid reducers) for mild heartburn.  Prescription medicines to decrease stomach acid or to protect your stomach lining.  Certain changes in your diet.  Raising the head of your bed so it is higher than the foot of the bed. This helps prevent stomach acid from backing up into the esophagus when you are lying down. Follow these instructions at home: Eating and drinking  Do not drink alcohol during your pregnancy.  Identify foods and beverages that make your symptoms worse, and avoid them.  Beverages that you may want to avoid include: ? Coffee and tea (with or without caffeine). ? Energy drinks and sports drinks. ? Carbonated drinks or sodas. ? Citrus fruit juices.  Foods that you may want to avoid include: ? Chocolate and cocoa. ? Peppermint and mint flavorings. ? Garlic, onions, and horseradish. ? Spicy and acidic foods, including peppers, chili powder, curry powder, vinegar, hot sauces, and barbecue sauce. ? Citrus fruits, such as oranges, lemons, and limes. ? Tomato-based foods, such as red sauce, chili, and salsa. ? Fried and fatty foods, such as donuts, french fries, potato chips, and high-fat dressings. ? High-fat meats, such as hot dogs, cold cuts, sausage, ham, and bacon. ? High-fat dairy items, such as whole milk, butter, and cheese.  Eat small, frequent meals instead of large meals.  Avoid drinking large amounts of liquid with your meals.  Avoid eating meals during the 2-3 hours before bedtime.  Avoid lying down right   after you eat.  Do not exercise right after you eat. Medicines  Take over-the-counter and prescription medicines only as told by your health care provider.  Do not take aspirin, ibuprofen, or other NSAIDs unless your health care provider tells  you to do that.  You may be instructed to avoid medicines that contain sodium bicarbonate. General instructions   If directed, raise the head of your bed about 6 inches (15 cm) by putting blocks under the legs. Sleeping with more pillows does not effectively relieve heartburn because it only changes the position of your head.  Do not use any products that contain nicotine or tobacco, such as cigarettes and e-cigarettes. If you need help quitting, ask your health care provider.  Wear loose-fitting clothing.  Try to reduce your stress, such as with yoga or meditation. If you need help managing stress, ask your health care provider.  Maintain a healthy weight. If you are overweight, work with your health care provider to safely lose weight.  Keep all follow-up visits as told by your health care provider. This is important. Contact a health care provider if:  You develop new symptoms.  Your symptoms do not improve with treatment.  You have unexplained weight loss.  You have difficulty swallowing.  You make loud sounds when you breathe (wheeze).  You have a cough that does not go away.  You have frequent heartburn for more than 2 weeks.  You have nausea or vomiting that does not get better with treatment.  You have pain in your abdomen. Get help right away if:  You have severe chest pain that spreads to your arm, neck, or jaw.  You feel sweaty, dizzy, or light-headed.  You have shortness of breath.  You have pain when swallowing.  You vomit, and your vomit looks like blood or coffee grounds.  Your stool is bloody or black. This information is not intended to replace advice given to you by your health care provider. Make sure you discuss any questions you have with your health care provider. Document Revised: 01/08/2018 Document Reviewed: 06/27/2016 Elsevier Patient Education  2020 Elsevier Inc.  

## 2020-01-27 NOTE — Progress Notes (Signed)
Patient reports feeling light flutters, denies pain.

## 2020-01-27 NOTE — Progress Notes (Signed)
   PRENATAL VISIT NOTE  Subjective:  Jeanette Becker is a 29 y.o. G2P1001 at [redacted]w[redacted]d being seen today for ongoing prenatal care.  She is currently monitored for the following issues for this low-risk pregnancy and has Supervision of other normal pregnancy, antepartum; LGSIL on Pap smear of cervix; and Chlamydia infection affecting pregnancy in first trimester on their problem list.  Patient reports heartburn and nausea.  Contractions: Not present. Vag. Bleeding: None.  Movement: Present. Denies leaking of fluid.   The following portions of the patient's history were reviewed and updated as appropriate: allergies, current medications, past family history, past medical history, past social history, past surgical history and problem list.   Objective:   Vitals:   01/27/20 0856  BP: 100/61  Pulse: 65  Weight: 148 lb 12.8 oz (67.5 kg)    Fetal Status: Fetal Heart Rate (bpm): 143   Movement: Present     General:  Alert, oriented and cooperative. Patient is in no acute distress.  Skin: Skin is warm and dry. No rash noted.   Cardiovascular: Normal heart rate noted  Respiratory: Normal respiratory effort, no problems with respiration noted  Abdomen: Soft, gravid, appropriate for gestational age.  Pain/Pressure: Absent     Pelvic: Cervical exam deferred        Extremities: Normal range of motion.  Edema: None  Mental Status: Normal mood and affect. Normal behavior. Normal judgment and thought content.   Assessment and Plan:  Pregnancy: G2P1001 at [redacted]w[redacted]d 1. Supervision of other normal pregnancy, antepartum --Anticipatory guidance about next visits/weeks of pregnancy given. --Next visit in 4 weeks virtual  2. Chlamydia infection affecting pregnancy in first trimester --Tx 2/26, partner also treated  - AFP, Serum, Open Spina Bifida - Cervicovaginal ancillary only( [place])  3. Heartburn during pregnancy in second trimester --Sometimes mild burning but mostly bad taste in her mouth  causing nausea and low appetite. --Will try Pepcid BID initially but consider PPI like Protonix if not effective - famotidine (PEPCID) 20 MG tablet; Take 1 tablet (20 mg total) by mouth 2 (two) times daily.  Dispense: 60 tablet; Refill: 2  Preterm labor symptoms and general obstetric precautions including but not limited to vaginal bleeding, contractions, leaking of fluid and fetal movement were reviewed in detail with the patient. Please refer to After Visit Summary for other counseling recommendations.   No follow-ups on file.  Future Appointments  Date Time Provider Department Center  02/10/2020  1:30 PM [facility] [place] 1 [facility]    [name], CNM

## 2020-01-28 LAB — CERVICOVAGINAL ANCILLARY ONLY
Chlamydia: NEGATIVE
Comment: NEGATIVE
Comment: NORMAL
Neisseria Gonorrhea: NEGATIVE

## 2020-01-29 LAB — AFP, SERUM, OPEN SPINA BIFIDA
AFP MoM: 1.15
AFP Value: 45.4 ng/mL
Gest. Age on Collection Date: 17 weeks
Maternal Age At EDD: 28.5 yr
OSBR Risk 1 IN: 7603
Test Results:: NEGATIVE
Weight: 148 [lb_av]

## 2020-02-03 ENCOUNTER — Telehealth: Payer: Self-pay

## 2020-02-03 NOTE — Telephone Encounter (Signed)
Returned call and answered questions about fetal movement at 18 weeks.

## 2020-02-10 ENCOUNTER — Other Ambulatory Visit: Payer: Self-pay

## 2020-02-10 ENCOUNTER — Other Ambulatory Visit (HOSPITAL_COMMUNITY): Payer: Self-pay | Admitting: *Deleted

## 2020-02-10 ENCOUNTER — Ambulatory Visit (HOSPITAL_COMMUNITY)
Admission: RE | Admit: 2020-02-10 | Discharge: 2020-02-10 | Disposition: A | Discharge status: Home / Self Care | Payer: Medicaid Other | Source: Ambulatory Visit | Attending: Advanced Practice Midwife | Admitting: Advanced Practice Midwife

## 2020-02-10 DIAGNOSIS — Z362 Encounter for other antenatal screening follow-up: Secondary | ICD-10-CM

## 2020-02-10 DIAGNOSIS — Z3480 Encounter for supervision of other normal pregnancy, unspecified trimester: Secondary | ICD-10-CM | POA: Diagnosis not present

## 2020-02-10 DIAGNOSIS — Z3A19 19 weeks gestation of pregnancy: Secondary | ICD-10-CM | POA: Diagnosis present

## 2020-02-20 ENCOUNTER — Inpatient Hospital Stay (HOSPITAL_COMMUNITY)
Admission: AD | Admit: 2020-02-20 | Discharge: 2020-02-20 | Disposition: A | Discharge status: Home / Self Care | Payer: Medicaid Other | Attending: Obstetrics and Gynecology | Admitting: Obstetrics and Gynecology

## 2020-02-20 ENCOUNTER — Encounter (HOSPITAL_COMMUNITY): Payer: Self-pay | Admitting: Obstetrics and Gynecology

## 2020-02-20 ENCOUNTER — Other Ambulatory Visit: Payer: Self-pay

## 2020-02-20 DIAGNOSIS — Z3A20 20 weeks gestation of pregnancy: Secondary | ICD-10-CM | POA: Diagnosis not present

## 2020-02-20 DIAGNOSIS — R102 Pelvic and perineal pain: Secondary | ICD-10-CM | POA: Insufficient documentation

## 2020-02-20 DIAGNOSIS — R109 Unspecified abdominal pain: Secondary | ICD-10-CM

## 2020-02-20 DIAGNOSIS — O26892 Other specified pregnancy related conditions, second trimester: Secondary | ICD-10-CM | POA: Insufficient documentation

## 2020-02-20 DIAGNOSIS — R1030 Lower abdominal pain, unspecified: Secondary | ICD-10-CM | POA: Insufficient documentation

## 2020-02-20 DIAGNOSIS — O26899 Other specified pregnancy related conditions, unspecified trimester: Secondary | ICD-10-CM

## 2020-02-20 DIAGNOSIS — A749 Chlamydial infection, unspecified: Secondary | ICD-10-CM

## 2020-02-20 LAB — URINALYSIS, ROUTINE W REFLEX MICROSCOPIC
Bacteria, UA: NONE SEEN
Bilirubin Urine: NEGATIVE
Glucose, UA: NEGATIVE mg/dL
Hgb urine dipstick: NEGATIVE
Ketones, ur: NEGATIVE mg/dL
Nitrite: NEGATIVE
Protein, ur: NEGATIVE mg/dL
Specific Gravity, Urine: 1.019 (ref 1.005–1.030)
pH: 6 (ref 5.0–8.0)

## 2020-02-20 LAB — WET PREP, GENITAL
Sperm: NONE SEEN
Trich, Wet Prep: NONE SEEN
Yeast Wet Prep HPF POC: NONE SEEN

## 2020-02-20 MED ORDER — IBUPROFEN 800 MG PO TABS
400.0000 mg | ORAL_TABLET | Freq: Once | ORAL | Status: AC
  Administered 2020-02-20: 22:00:00 400 mg via ORAL
  Filled 2020-02-20: qty 1

## 2020-02-20 NOTE — MAU Provider Note (Signed)
Chief Complaint:  Abdominal Pain   First Provider Initiated Contact with Patient 02/20/20 2124     HPI: Jeanette Becker is a 29 y.o. G2P1001 at 84w6dho presents to maternity admissions reporting lower abdominal pain and pressure.  Feels tightening up and pressure.  Has been taking Azo Cranberry and feels it helped some. She denies LOF, vaginal bleeding, vaginal itching/burning, urinary symptoms, h/a, dizziness, n/v, diarrhea, constipation or fever/chills.    Abdominal Pain This is a new problem. The current episode started today. The problem occurs intermittently. The problem has been unchanged. The pain is located in the suprapubic region, LLQ and RLQ. The quality of the pain is cramping and dull. The abdominal pain does not radiate. Pertinent negatives include no constipation, diarrhea, dysuria, fever, frequency, myalgias, nausea or vomiting. Nothing aggravates the pain. The pain is relieved by nothing. Treatments tried: AZO cranberry. The treatment provided mild relief.    RN note: Jeanette Pinnixis a 29y.o. at 272w6dere in MAU reporting increased lower abdominal pain and pressure recently, in addition to discomfort post intercourse. Onset of complaint: today Pain score: 4/10   FHT:155 via doppler Lab orders placed from triage:  UA  Past Medical History: History reviewed. No pertinent past medical history.  Past obstetric history: OB History  Gravida Para Term Preterm AB Living  '2 1 1     1  '$ SAB TAB Ectopic Multiple Live Births          1    # Outcome Date GA Lbr Len/2nd Weight Sex Delivery Anes PTL Lv  2 Current           1 Term 09/19/08     Vag-Spont   LIV    Past Surgical History: No past surgical history on file.  Family History: No family history on file.  Social History: Social History   Tobacco Use  . Smoking status: Never Smoker  . Smokeless tobacco: Never Used  Substance Use Topics  . Alcohol use: No  . Drug use: No    Allergies: No Known  Allergies  Meds:  Medications Prior to Admission  Medication Sig Dispense Refill Last Dose  . Blood Pressure Monitoring (BLOOD PRESSURE KIT) DEVI 1 kit by Does not apply route as needed. 1 each 0   . butalbital-acetaminophen-caffeine (FIORICET) 50-325-40 MG tablet Take 1-2 tablets by mouth every 6 (six) hours as needed for headache. 20 tablet 0   . famotidine (PEPCID) 20 MG tablet Take 1 tablet (20 mg total) by mouth 2 (two) times daily. 60 tablet 2   . levonorgestrel (MIRENA) 20 MCG/24HR IUD by Intrauterine route.     . Multiple Vitamin (MULTIVITAMIN) tablet Take 1 tablet by mouth daily.     . Prenatal Vit-Fe Fumarate-FA (PRENATAL MULTIVITAMIN) TABS tablet Take 1 tablet by mouth daily at 12 noon.     . Prenatal Vit-Fe Phos-FA-Omega (VITAFOL GUMMIES) 3.33-0.333-34.8 MG CHEW Chew 3 each by mouth daily. 90 tablet 11   . promethazine (PHENERGAN) 25 MG tablet Take 1 tablet (25 mg total) by mouth every 6 (six) hours as needed for nausea or vomiting. 30 tablet 0     I have reviewed patient's Past Medical Hx, Surgical Hx, Family Hx, Social Hx, medications and allergies.   ROS:  Review of Systems  Constitutional: Negative for fever.  Gastrointestinal: Positive for abdominal pain. Negative for constipation, diarrhea, nausea and vomiting.  Genitourinary: Negative for dysuria and frequency.  Musculoskeletal: Negative for myalgias.   Other systems negative  Physical Exam   Patient Vitals for the past 24 hrs:  BP Temp Pulse Resp Weight  02/20/20 2111 (!) 112/56 98.4 F (36.9 C) 82 19 --  02/20/20 2102 -- -- -- -- 67.3 kg   Constitutional: Well-developed, well-nourished female in no acute distress.  Cardiovascular: normal rate and rhythm Respiratory: normal effort, clear to auscultation bilaterally GI: Abd soft, non-tender except over bilateral round ligaments, uterus is gravid, appropriate for gestational age.   No rebound or guarding. MS: Extremities nontender, no edema, normal  ROM Neurologic: Alert and oriented x 4.  GU: Neg CVAT.  PELVIC EXAM:  Dilation: Fingertip Effacement (%): Thick Station: Ballotable Exam by:: Hansel Feinstein, CNM   FHT:  155   Labs: Results for orders placed or performed during the hospital encounter of 02/20/20 (from the past 24 hour(s))  Urinalysis, Routine w reflex microscopic     Status: Abnormal   Collection Time: 02/20/20  9:13 PM  Result Value Ref Range   Color, Urine YELLOW YELLOW   APPearance HAZY (A) CLEAR   Specific Gravity, Urine 1.019 1.005 - 1.030   pH 6.0 5.0 - 8.0   Glucose, UA NEGATIVE NEGATIVE mg/dL   Hgb urine dipstick NEGATIVE NEGATIVE   Bilirubin Urine NEGATIVE NEGATIVE   Ketones, ur NEGATIVE NEGATIVE mg/dL   Protein, ur NEGATIVE NEGATIVE mg/dL   Nitrite NEGATIVE NEGATIVE   Leukocytes,Ua SMALL (A) NEGATIVE   RBC / HPF 0-5 0 - 5 RBC/hpf   WBC, UA 6-10 0 - 5 WBC/hpf   Bacteria, UA NONE SEEN NONE SEEN   Squamous Epithelial / LPF 0-5 0 - 5   Mucus PRESENT   Wet prep, genital     Status: Abnormal   Collection Time: 02/20/20 10:15 PM   Specimen: Vaginal  Result Value Ref Range   Yeast Wet Prep HPF POC NONE SEEN NONE SEEN   Trich, Wet Prep NONE SEEN NONE SEEN   Clue Cells Wet Prep HPF POC PRESENT (A) NONE SEEN   WBC, Wet Prep HPF POC FEW (A) NONE SEEN   Sperm NONE SEEN     O/Positive/-- (02/22 1520)  Imaging:    MAU Course/MDM: I have ordered labs and reviewed results. UA is clear.  Suspect the pain was either round ligament pain or cramping of uterus with growth.  PTL precautions since cervix was fingertip and long (possibly due to being a multip cx)   Treatments in MAU included ibuprofen which did help cramping.  Wet prep and cultures done per patient request due to having chlamydia earlier in pregnancy.    Assessment: [place] IUP at 33w6dPelvic cramping Hx chlamydia early pregnancy  Plan: Discharge home Preterm Labor precautions  Advised to take day off tomorrow and hydrate Follow up in  Office for prenatal visits   Encouraged to return here or to other Urgent Care/ED if she develops worsening of symptoms, increase in pain, fever, or other concerning symptoms.   Pt stable at time of discharge.  MHansel FeinsteinCNM, MSN Certified Nurse-Midwife 02/20/2020 9:24 PM

## 2020-02-20 NOTE — MAU Note (Signed)
.  Jeanette Becker is a 29 y.o. at [redacted]w[redacted]d here in MAU reporting increased lower abdominal pain and pressure recently, in addition to discomfort post intercourse. Onset of complaint: today Pain score: 4/10   FHT:155 via doppler Lab orders placed from triage:  UA

## 2020-02-20 NOTE — Discharge Instructions (Signed)
Abdominal Pain During Pregnancy  Abdominal pain is common during pregnancy, and has many possible causes. Some causes are more serious than others, and sometimes the cause is not known. Abdominal pain can be a sign that labor is starting. It can also be caused by normal growth and stretching of muscles and ligaments during pregnancy. Always tell your health care provider if you have any abdominal pain. Follow these instructions at home:  Do not have sex or put anything in your vagina until your pain goes away completely.  Get plenty of rest until your pain improves.  Drink enough fluid to keep your urine pale yellow.  Take over-the-counter and prescription medicines only as told by your health care provider.  Keep all follow-up visits as told by your health care provider. This is important. Contact a health care provider if:  Your pain continues or gets worse after resting.  You have lower abdominal pain that: ? Comes and goes at regular intervals. ? Spreads to your back. ? Is similar to menstrual cramps.  You have pain or burning when you urinate. Get help right away if:  You have a fever or chills.  You have vaginal bleeding.  You are leaking fluid from your vagina.  You are passing tissue from your vagina.  You have vomiting or diarrhea that lasts for more than 24 hours.  Your baby is moving less than usual.  You feel very weak or faint.  You have shortness of breath.  You develop severe pain in your upper abdomen. Summary  Abdominal pain is common during pregnancy, and has many possible causes.  If you experience abdominal pain during pregnancy, tell your health care provider right away.  Follow your health care provider's home care instructions and keep all follow-up visits as directed. This information is not intended to replace advice given to you by your health care provider. Make sure you discuss any questions you have with your health care  provider. Document Revised: 01/28/2019 Document Reviewed: 01/12/2017 Elsevier Patient Education  2020 Elsevier Inc.  

## 2020-02-21 LAB — GC/CHLAMYDIA PROBE AMP (~~LOC~~) NOT AT ARMC
Chlamydia: NEGATIVE
Comment: NEGATIVE
Comment: NORMAL
Neisseria Gonorrhea: NEGATIVE

## 2020-02-24 ENCOUNTER — Other Ambulatory Visit: Payer: Self-pay

## 2020-02-24 ENCOUNTER — Encounter: Payer: Self-pay | Admitting: Obstetrics

## 2020-02-24 ENCOUNTER — Other Ambulatory Visit (HOSPITAL_COMMUNITY)
Admission: RE | Admit: 2020-02-24 | Discharge: 2020-02-24 | Disposition: A | Discharge status: Home / Self Care | Payer: Medicaid Other | Source: Ambulatory Visit | Attending: Obstetrics | Admitting: Obstetrics

## 2020-02-24 ENCOUNTER — Ambulatory Visit (INDEPENDENT_AMBULATORY_CARE_PROVIDER_SITE_OTHER): Payer: Medicaid Other | Admitting: Obstetrics

## 2020-02-24 VITALS — BP 100/58 | HR 90 | Wt 150.0 lb

## 2020-02-24 DIAGNOSIS — O26899 Other specified pregnancy related conditions, unspecified trimester: Secondary | ICD-10-CM

## 2020-02-24 DIAGNOSIS — N898 Other specified noninflammatory disorders of vagina: Secondary | ICD-10-CM | POA: Insufficient documentation

## 2020-02-24 DIAGNOSIS — B9689 Other specified bacterial agents as the cause of diseases classified elsewhere: Secondary | ICD-10-CM

## 2020-02-24 DIAGNOSIS — N760 Acute vaginitis: Secondary | ICD-10-CM

## 2020-02-24 MED ORDER — TINIDAZOLE 500 MG PO TABS: 1000.0000 mg | ORAL_TABLET | Freq: Every day | ORAL | 2 refills | Status: DC

## 2020-02-24 NOTE — Progress Notes (Signed)
Pt states she does not have much appetite. Advised she may want to try protein shakes-  Ensure/Boost Pt states she is having d/c with odor and would like full std screen.

## 2020-02-24 NOTE — Progress Notes (Signed)
Subjective:  Jeanette Becker is a 29 y.o. G2P1001 at [redacted]w[redacted]d being seen today for ongoing prenatal care.  She is currently monitored for the following issues for this low-risk pregnancy and has Supervision of other normal pregnancy, antepartum; LGSIL on Pap smear of cervix; and Chlamydia infection affecting pregnancy in first trimester on their problem list.  Patient reports malodorous vaginal discharge.  Contractions: Not present. Vag. Bleeding: None.  Movement: Present. Denies leaking of fluid.   The following portions of the patient's history were reviewed and updated as appropriate: allergies, current medications, past family history, past medical history, past social history, past surgical history and problem list. Problem list updated.  Objective:   Vitals:   02/24/20 1126  BP: (!) 100/58  Pulse: 90  Weight: 150 lb (68 kg)    Fetal Status:     Movement: Present     General:  Alert, oriented and cooperative. Patient is in no acute distress.  Skin: Skin is warm and dry. No rash noted.   Cardiovascular: Normal heart rate noted  Respiratory: Normal respiratory effort, no problems with respiration noted  Abdomen: Soft, gravid, appropriate for gestational age. Pain/Pressure: Present     Pelvic:  Cervical exam deferred        Extremities: Normal range of motion.     Mental Status: Normal mood and affect. Normal behavior. Normal judgment and thought content.   Urinalysis:      Assessment and Plan:  Pregnancy: G2P1001 at [redacted]w[redacted]d  1. Vaginal discharge during pregnancy, antepartum Rx: - Cervicovaginal ancillary only( [place])  2. BV (bacterial vaginosis) Rx: - tinidazole (TINDAMAX) 500 MG tablet; Take 2 tablets (1,000 mg total) by mouth daily with breakfast.  Dispense: 10 tablet; Refill: 2  Preterm labor symptoms and general obstetric precautions including but not limited to vaginal bleeding, contractions, leaking of fluid and fetal movement were reviewed in detail with the  patient. Please refer to After Visit Summary for other counseling recommendations.   Return in about 4 weeks (around 03/23/2020) for MyChart.   Brock Bad, MD  02/24/2020

## 2020-02-25 LAB — CERVICOVAGINAL ANCILLARY ONLY
Bacterial Vaginitis (gardnerella): POSITIVE — AB
Candida Glabrata: NEGATIVE
Candida Vaginitis: POSITIVE — AB
Chlamydia: NEGATIVE
Comment: NEGATIVE
Comment: NEGATIVE
Comment: NEGATIVE
Comment: NEGATIVE
Comment: NEGATIVE
Comment: NORMAL
Neisseria Gonorrhea: NEGATIVE
Trichomonas: NEGATIVE

## 2020-02-26 ENCOUNTER — Other Ambulatory Visit: Payer: Self-pay | Admitting: Obstetrics

## 2020-02-26 DIAGNOSIS — B373 Candidiasis of vulva and vagina: Secondary | ICD-10-CM

## 2020-02-26 DIAGNOSIS — B3731 Acute candidiasis of vulva and vagina: Secondary | ICD-10-CM

## 2020-02-26 MED ORDER — TERCONAZOLE 0.4 % VA CREA: 1.0000 | TOPICAL_CREAM | Freq: Every day | VAGINAL | 0 refills | Status: DC

## 2020-03-10 ENCOUNTER — Other Ambulatory Visit: Payer: Self-pay

## 2020-03-10 ENCOUNTER — Ambulatory Visit (HOSPITAL_COMMUNITY): Payer: Medicaid Other | Attending: Obstetrics and Gynecology

## 2020-03-10 ENCOUNTER — Ambulatory Visit: Payer: Medicaid Other | Admitting: *Deleted

## 2020-03-10 ENCOUNTER — Other Ambulatory Visit: Payer: Self-pay | Admitting: *Deleted

## 2020-03-10 VITALS — BP 105/51 | HR 83

## 2020-03-10 DIAGNOSIS — O0990 Supervision of high risk pregnancy, unspecified, unspecified trimester: Secondary | ICD-10-CM | POA: Diagnosis present

## 2020-03-10 DIAGNOSIS — Z362 Encounter for other antenatal screening follow-up: Secondary | ICD-10-CM | POA: Diagnosis not present

## 2020-03-10 DIAGNOSIS — Z3A23 23 weeks gestation of pregnancy: Secondary | ICD-10-CM

## 2020-03-24 ENCOUNTER — Telehealth (INDEPENDENT_AMBULATORY_CARE_PROVIDER_SITE_OTHER): Payer: Medicaid Other | Admitting: Advanced Practice Midwife

## 2020-03-24 DIAGNOSIS — Z3482 Encounter for supervision of other normal pregnancy, second trimester: Secondary | ICD-10-CM

## 2020-03-24 DIAGNOSIS — Z3480 Encounter for supervision of other normal pregnancy, unspecified trimester: Secondary | ICD-10-CM

## 2020-03-24 DIAGNOSIS — Z3A25 25 weeks gestation of pregnancy: Secondary | ICD-10-CM

## 2020-03-24 NOTE — Progress Notes (Signed)
OBSTETRICS PRENATAL VIRTUAL VISIT ENCOUNTER NOTE  [facility]   I connected with Jeanette Alaminocio Ojeda Becker on 03/24/20 at 11:15 AM EDT by MyChart Video Encounter at home and verified that I am speaking with the correct person using two identifiers.   I discussed the limitations, risks, security and privacy concerns of performing an evaluation and management service virtually and the availability of in person appointments. I also discussed with the patient that there may be a patient responsible charge related to this service. The patient expressed understanding and agreed to proceed. Subjective:  Jeanette Becker is a 29 y.o. G2P1001 at 6014w4d being seen today for ongoing prenatal care.  She is currently monitored for the following issues for this low-risk pregnancy and has Supervision of other normal pregnancy, antepartum; LGSIL on Pap smear of cervix; and Chlamydia infection affecting pregnancy in first trimester on their problem list.  Patient reports no complaints. She is excited to finally be past the fatigue and nausea she experienced up to this point in her pregnancy.   Contractions: Not present. Vag. Bleeding: None.  Movement: Present. Denies any leaking of fluid.   The following portions of the patient's history were reviewed and updated as appropriate: allergies, current medications, past family history, past medical history, past social history, past surgical history and problem list.   Objective:   Vitals:   03/24/20 0851  BP: (!) 105/56  Pulse: 85    Fetal Status:     Movement: Present     General:  Alert, oriented and cooperative. Patient is in no acute distress.  Respiratory: Normal respiratory effort, no problems with respiration noted  Mental Status: Normal mood and affect. Normal behavior. Normal judgment and thought content.  Rest of physical exam deferred due to type of encounter  Imaging: US MFM OB FOLLOW  UP  Result Date: 03/11/2020 ----------------------------------------------------------------------  OBSTETRICS REPORT                         (Signed Final 03/11/2020 08:39 am) ---------------------------------------------------------------------- Patient Info  ID #:        [id number]                          D.O.B.:  1991/03/17 (29 yrs)  Name:        Jeanette Becker           Visit Date: 03/10/2020 12:09 pm ---------------------------------------------------------------------- Performed By  Attending:         Lin Landsmanorenthian Booker      Ref. Address:      [address]                     [address]                                        [address]                                                               [address] [id number]  [place]                                                               [id number]  Performed By:      Emeline Darling BS,      [facility]  Referred By:       [facility] ---------------------------------------------------------------------- Orders  #   Description                          Code         Ordered By  1   [place] MFM OB FOLLOW UP                  [phone]     YU FANG ----------------------------------------------------------------------  #   Order #                    Accession #                 Episode #  1   [id number]                  [id number]                  [id number] ---------------------------------------------------------------------- Indications  Encounter for other antenatal screening         Z36.2  follow-up  Negative Horizon  [redacted] weeks gestation of pregnancy                 Z3A.23 ---------------------------------------------------------------------- Fetal Evaluation  Num Of Fetuses:          1  Fetal Heart Rate(bpm):   127  Cardiac Activity:        Observed  Presentation:            Cephalic  Placenta:                Posterior  P. Cord  Insertion:       Previously Visualized  Amniotic Fluid  AFI FV:      Within normal limits                              Largest Pocket(cm)                              4.1 ---------------------------------------------------------------------- Biometry  BPD:      61.1   mm     G. Age:  24w 6d         86  %    CI:         77.31  %    70 - 86  FL/HC:       19.7  %    18.7 - 20.9  HC:        220   mm     G. Age:  24w 0d         52  %    HC/AC:       1.08       1.05 - 1.21  AC:      203.9   mm     G. Age:  25w 0d         83  %    FL/BPD:      71.0  %    71 - 87  FL:       43.4   mm     G. Age:  24w 2d         60  %    FL/AC:       21.3  %    20 - 24  Est. FW:     715   gm      1 lb 9 oz     87  % ---------------------------------------------------------------------- OB History  Blood Type:    O+  Gravidity:     2         Term:  1          Prem:  0        SAB:   0  TOP:           0       Ectopic: 0         Living: 1 ---------------------------------------------------------------------- Gestational Age  LMP:            23w 4d       Date:  09/27/19                   EDD:  07/03/20  U/S Today:      24w 4d                                         EDD:  06/26/20  Best:           23w 4d    Det. By:  LMP  (09/27/19)            EDD:  07/03/20 ---------------------------------------------------------------------- Anatomy  Cranium:                Appears normal         LVOT:                   Not well visualized  Cavum:                  Appears normal         Aortic Arch:            Appears normal  Ventricles:             Appears normal         Ductal Arch:            Not well visualized  Choroid Plexus:         Previously seen        Diaphragm:              Appears normal  Cerebellum:  Previously seen        Stomach:                Appears normal, left                                                                         sided  Posterior Fossa:        Previously  seen        Abdomen:                Appears normal  Nuchal Fold:            Previously seen        Abdominal Wall:         Previously seen  Face:                   Orbits and profile     Cord Vessels:           Previously seen                          previously seen  Lips:                   Appears normal         Kidneys:                Appear normal  Palate:                 Not well visualized    Bladder:                Appears normal  Thoracic:               Appears normal         Spine:                  Previously seen  Heart:                  Appears normal         Upper Extremities:      Previously seen                          (4CH, axis, and situs)  RVOT:                   Appears normal         Lower Extremities:      Previously seen  Other:   Female gender Nasal bone, Heels, Left 5th digit, Right Open hand           previously visualized. Technically difficult due to fetal position. ---------------------------------------------------------------------- Cervix Uterus Adnexa  Cervix  Not visualized (advanced GA >24wks) ---------------------------------------------------------------------- Impression  Follow up anatomy with measurements consistent with dates  Good fetal fetal movement and amniotic fluid  Suboptimal heart views again seen given fetal position. ---------------------------------------------------------------------- Recommendations  Follow up anatomy and growth is scheduled in 4 weeks. ----------------------------------------------------------------------               Sander Nephew, MD Electronically Signed Final Report   03/11/2020 08:39 am ----------------------------------------------------------------------  Assessment and Plan:  Pregnancy: G2P1001 at [redacted]w[redacted]d 1. Supervision of other normal pregnancy, antepartum - Preemptive teaching 2 hour GTT, TDAP, labs next visit - Preemptive teaching daily kick counts beginning at 30 weeks - Planning patch for contraception  postpartum  Preterm labor symptoms and general obstetric precautions including but not limited to vaginal bleeding, contractions, leaking of fluid and fetal movement were reviewed in detail with the patient. I discussed the assessment and treatment plan with the patient. The patient was provided an opportunity to ask questions and all were answered. The patient agreed with the plan and demonstrated an understanding of the instructions. The patient was advised to call back or seek an in-person office evaluation/go to MAU at [facility] for any urgent or concerning symptoms. Please refer to After Visit Summary for other counseling recommendations.   I provided seven minutes of face-to-face time during this encounter.  Return in about 2 weeks (around 04/07/2020) for 2 hour GTT, third trimester labs, TDAP.  Future Appointments  Date Time Provider Department Center  03/24/2020 11:15 AM Calvert Cantor, CNM [facility] None  04/07/2020  1:15 PM [facility] NURSE [facility]  04/07/2020  1:15 PM [facility] US2 [facility]    Calvert Cantor, CNM [facility] [employer] [place] [facility]

## 2020-03-24 NOTE — Patient Instructions (Addendum)

## 2020-03-24 NOTE — Progress Notes (Signed)
I connected with  Jeanette Becker on 03/24/20 by a video enabled telemedicine application and verified that I am speaking with the correct person using two identifiers.   I discussed the limitations of evaluation and management by telemedicine. The patient expressed understanding and agreed to proceed.  MyChart OB, reports no problems today.

## 2020-04-07 ENCOUNTER — Ambulatory Visit: Payer: Medicaid Other | Admitting: *Deleted

## 2020-04-07 ENCOUNTER — Other Ambulatory Visit: Payer: Self-pay

## 2020-04-07 ENCOUNTER — Other Ambulatory Visit: Payer: Medicaid Other

## 2020-04-07 ENCOUNTER — Ambulatory Visit: Payer: Medicaid Other | Attending: Maternal & Fetal Medicine

## 2020-04-07 ENCOUNTER — Ambulatory Visit (INDEPENDENT_AMBULATORY_CARE_PROVIDER_SITE_OTHER): Payer: Medicaid Other | Admitting: Obstetrics & Gynecology

## 2020-04-07 ENCOUNTER — Encounter: Payer: Self-pay | Admitting: *Deleted

## 2020-04-07 VITALS — BP 100/57 | HR 94

## 2020-04-07 DIAGNOSIS — Z362 Encounter for other antenatal screening follow-up: Secondary | ICD-10-CM | POA: Diagnosis not present

## 2020-04-07 DIAGNOSIS — Z3482 Encounter for supervision of other normal pregnancy, second trimester: Secondary | ICD-10-CM

## 2020-04-07 DIAGNOSIS — Z3A27 27 weeks gestation of pregnancy: Secondary | ICD-10-CM

## 2020-04-07 DIAGNOSIS — Z3480 Encounter for supervision of other normal pregnancy, unspecified trimester: Secondary | ICD-10-CM

## 2020-04-07 NOTE — Progress Notes (Signed)
Patient reports fetal movement, denies pain. 

## 2020-04-07 NOTE — Progress Notes (Signed)
   PRENATAL VISIT NOTE  Subjective:  Jeanette Becker is a 29 y.o. G2P1001 at [redacted]w[redacted]d being seen today for ongoing prenatal care.  She is currently monitored for the following issues for this low-risk pregnancy and has Supervision of other normal pregnancy, antepartum; LGSIL on Pap smear of cervix; and Chlamydia infection affecting pregnancy in first trimester on their problem list.  Patient reports no complaints.  Contractions: Not present. Vag. Bleeding: None.  Movement: Present. Denies leaking of fluid.   The following portions of the patient's history were reviewed and updated as appropriate: allergies, current medications, past family history, past medical history, past social history, past surgical history and problem list.   Objective:   Vitals:   04/07/20 0826  BP: (!) 98/59  Pulse: 78  Weight: 150 lb (68 kg)    Fetal Status: Fetal Heart Rate (bpm): 142   Movement: Present     General:  Alert, oriented and cooperative. Patient is in no acute distress.  Skin: Skin is warm and dry. No rash noted.   Cardiovascular: Normal heart rate noted  Respiratory: Normal respiratory effort, no problems with respiration noted  Abdomen: Soft, gravid, appropriate for gestational age.  Pain/Pressure: Absent     Pelvic: Cervical exam deferred        Extremities: Normal range of motion.  Edema: None  Mental Status: Normal mood and affect. Normal behavior. Normal judgment and thought content.   Assessment and Plan:  Pregnancy: G2P1001 at [redacted]w[redacted]d 1. Supervision of other normal pregnancy, antepartum 2hr GTT today, doing well otherwise.   Preterm labor symptoms and general obstetric precautions including but not limited to vaginal bleeding, contractions, leaking of fluid and fetal movement were reviewed in detail with the patient. Please refer to After Visit Summary for other counseling recommendations.   No follow-ups on file.  Future Appointments  Date Time Provider Department Center    04/07/2020  1:15 PM [facility] NURSE [facility]  04/07/2020  1:15 PM [facility] US2 [facility]    Malachy Chamber, MD

## 2020-04-08 LAB — CBC
Hematocrit: 34.8 % (ref 34.0–46.6)
Hemoglobin: 11.4 g/dL (ref 11.1–15.9)
MCH: 31.5 pg (ref 26.6–33.0)
MCHC: 32.8 g/dL (ref 31.5–35.7)
MCV: 96 fL (ref 79–97)
Platelets: 286 10*3/uL (ref 150–450)
RBC: 3.62 x10E6/uL — ABNORMAL LOW (ref 3.77–5.28)
RDW: 12.6 % (ref 11.7–15.4)
WBC: 6.7 10*3/uL (ref 3.4–10.8)

## 2020-04-08 LAB — GLUCOSE TOLERANCE, 2 HOURS W/ 1HR
Glucose, 1 hour: 175 mg/dL (ref 65–179)
Glucose, 2 hour: 149 mg/dL (ref 65–152)
Glucose, Fasting: 81 mg/dL (ref 65–91)

## 2020-04-08 LAB — RPR: RPR Ser Ql: NONREACTIVE

## 2020-04-08 LAB — HIV ANTIBODY (ROUTINE TESTING W REFLEX): HIV Screen 4th Generation wRfx: NONREACTIVE

## 2020-04-16 ENCOUNTER — Other Ambulatory Visit: Payer: Self-pay

## 2020-04-16 ENCOUNTER — Inpatient Hospital Stay (HOSPITAL_COMMUNITY)
Admission: AD | Admit: 2020-04-16 | Discharge: 2020-04-16 | Disposition: A | Discharge status: Home / Self Care | Payer: Medicaid Other | Attending: Obstetrics and Gynecology | Admitting: Obstetrics and Gynecology

## 2020-04-16 ENCOUNTER — Encounter (HOSPITAL_COMMUNITY): Payer: Self-pay | Admitting: Obstetrics and Gynecology

## 2020-04-16 DIAGNOSIS — R109 Unspecified abdominal pain: Secondary | ICD-10-CM | POA: Diagnosis present

## 2020-04-16 DIAGNOSIS — R1030 Lower abdominal pain, unspecified: Secondary | ICD-10-CM | POA: Insufficient documentation

## 2020-04-16 DIAGNOSIS — O26893 Other specified pregnancy related conditions, third trimester: Secondary | ICD-10-CM | POA: Diagnosis not present

## 2020-04-16 DIAGNOSIS — Z3A28 28 weeks gestation of pregnancy: Secondary | ICD-10-CM | POA: Insufficient documentation

## 2020-04-16 LAB — URINALYSIS, ROUTINE W REFLEX MICROSCOPIC
Bacteria, UA: NONE SEEN
Bilirubin Urine: NEGATIVE
Glucose, UA: 150 mg/dL — AB
Hgb urine dipstick: NEGATIVE
Ketones, ur: NEGATIVE mg/dL
Leukocytes,Ua: NEGATIVE
Nitrite: NEGATIVE
Protein, ur: 30 mg/dL — AB
Specific Gravity, Urine: 1.025 (ref 1.005–1.030)
pH: 6 (ref 5.0–8.0)

## 2020-04-16 LAB — WET PREP, GENITAL
Sperm: NONE SEEN
Trich, Wet Prep: NONE SEEN
Yeast Wet Prep HPF POC: NONE SEEN

## 2020-04-16 LAB — FETAL FIBRONECTIN: Fetal Fibronectin: NEGATIVE

## 2020-04-16 NOTE — MAU Provider Note (Signed)
Patient Jeanette Becker is a 29 y.o. G2P1001  at 13w6dhere with complaints of cramping that started at 1 pm. She denies vaginal bleeding, LOF, decreased fetal movements. She denies fever, SOB, nausea, vomiting, diarrhea.   She denies any complications in this pregnancy or past pregnancy. She had an NSVD in 2009.   She noticed that she had some excess discharge this morning. She then went to work and felt some cramping at 1 pm and so decided to come in to get checked out since her last labor started like this. She called [facility] at midday, and was advised to "keep an eye on it" but when she started cramping she decided to come in for evaluation.   History     CSN: [id number] Arrival date and time: 04/16/20 1710   None     Chief Complaint  Patient presents with  . Abdominal Pain   Abdominal Pain This is a new problem. The current episode started today. The pain is at a severity of 6/10. The abdominal pain does not radiate. Pertinent negatives include no constipation, diarrhea, dysuria, fever or nausea. The pain is aggravated by movement. The pain is relieved by being still. She has tried nothing for the symptoms.    OB History    Gravida  2   Para  1   Term  1   Preterm      AB      Living  1     SAB      TAB      Ectopic      Multiple      Live Births  1           Past Medical History:  Diagnosis Date  . Medical history non-contributory     Past Surgical History:  Procedure Laterality Date  . NO PAST SURGERIES      History reviewed. No pertinent family history.  Social History   Tobacco Use  . Smoking status: Never Smoker  . Smokeless tobacco: Never Used  Vaping Use  . Vaping Use: Never used  Substance Use Topics  . Alcohol use: No  . Drug use: No    Allergies: No Known Allergies  Medications Prior to Admission  Medication Sig Dispense Refill Last Dose  . Prenatal Vit-Fe Phos-FA-Omega (VITAFOL GUMMIES) 3.33-0.333-34.8 MG CHEW Chew  3 each by mouth daily. 90 tablet 11 04/15/2020 at Unknown time  . Blood Pressure Monitoring (BLOOD PRESSURE KIT) DEVI 1 kit by Does not apply route as needed. 1 each 0   . butalbital-acetaminophen-caffeine (FIORICET) 50-325-40 MG tablet Take 1-2 tablets by mouth every 6 (six) hours as needed for headache. (Patient not taking: Reported on 03/10/2020) 20 tablet 0   . famotidine (PEPCID) 20 MG tablet Take 1 tablet (20 mg total) by mouth 2 (two) times daily. (Patient not taking: Reported on 03/10/2020) 60 tablet 2   . Multiple Vitamin (MULTIVITAMIN) tablet Take 1 tablet by mouth daily.     . promethazine (PHENERGAN) 25 MG tablet Take 1 tablet (25 mg total) by mouth every 6 (six) hours as needed for nausea or vomiting. (Patient not taking: Reported on 03/10/2020) 30 tablet 0   . terconazole (TERAZOL 7) 0.4 % vaginal cream Place 1 applicator vaginally at bedtime. (Patient not taking: Reported on 03/10/2020) 45 g 0   . tinidazole (TINDAMAX) 500 MG tablet Take 2 tablets (1,000 mg total) by mouth daily with breakfast. (Patient not taking: Reported on 03/10/2020) 10 tablet 2  Review of Systems  Constitutional: Negative for fever.  Gastrointestinal: Positive for abdominal pain. Negative for constipation, diarrhea and nausea.  Genitourinary: Negative for dysuria.  Neurological: Negative.   Psychiatric/Behavioral: Negative.    Physical Exam   Blood pressure 113/67, pulse 95, temperature 98.4 F (36.9 C), temperature source Oral, resp. rate 16, height 5' (1.524 m), weight 58.2 kg, last menstrual period 09/27/2019, SpO2 98 %.  Physical Exam  Constitutional: She appears well-developed.  Genitourinary:    Vagina, right adnexa and rectum normal.  Cervix exhibits no motion tenderness and no discharge.  Neurological: She is alert.  Skin: Skin is warm.    MAU Course  Procedures  MDM -NST: 140 bpm, mod var, present acel, neg decels, no contractions.  -ffn negative -wet prep negative  -UA show mild  protein but no other concerning signs Assessment and Plan   1. Lower abdominal pain    2. GC CT pending  3. Patient stable for discharge; strict return precautions.   4. Patient to keep upcoming prenatal appointment.    Mervyn Skeeters James Senn 04/16/2020, 6:17 PM

## 2020-04-16 NOTE — MAU Note (Signed)
Is cramping, feeling pressure, had some weird d/c(like snot). No bleeding or leaking.  No hx of PTL

## 2020-04-16 NOTE — Discharge Instructions (Signed)

## 2020-04-17 LAB — GC/CHLAMYDIA PROBE AMP (~~LOC~~) NOT AT ARMC
Chlamydia: NEGATIVE
Comment: NEGATIVE
Comment: NORMAL
Neisseria Gonorrhea: NEGATIVE

## 2020-05-05 ENCOUNTER — Other Ambulatory Visit: Payer: Self-pay

## 2020-05-05 ENCOUNTER — Ambulatory Visit (INDEPENDENT_AMBULATORY_CARE_PROVIDER_SITE_OTHER): Payer: Medicaid Other | Admitting: Obstetrics and Gynecology

## 2020-05-05 VITALS — BP 99/64 | HR 71 | Wt 150.0 lb

## 2020-05-05 DIAGNOSIS — O3443 Maternal care for other abnormalities of cervix, third trimester: Secondary | ICD-10-CM

## 2020-05-05 DIAGNOSIS — Z23 Encounter for immunization: Secondary | ICD-10-CM | POA: Diagnosis not present

## 2020-05-05 DIAGNOSIS — Z3480 Encounter for supervision of other normal pregnancy, unspecified trimester: Secondary | ICD-10-CM

## 2020-05-05 DIAGNOSIS — R87612 Low grade squamous intraepithelial lesion on cytologic smear of cervix (LGSIL): Secondary | ICD-10-CM

## 2020-05-05 DIAGNOSIS — Z3A31 31 weeks gestation of pregnancy: Secondary | ICD-10-CM

## 2020-05-05 NOTE — Progress Notes (Signed)
   PRENATAL VISIT NOTE  Subjective:  Jeanette Becker is a 29 y.o. G2P1001 at [redacted]w[redacted]d being seen today for ongoing prenatal care.  She is currently monitored for the following issues for this low-risk pregnancy and has Supervision of other normal pregnancy, antepartum; LGSIL on Pap smear of cervix; and Chlamydia infection affecting pregnancy in first trimester on their problem list.  Patient doing well with no acute concerns today. She reports intermittent itching, but no rash.  Contractions: Irregular. Vag. Bleeding: None.  Movement: Present. Denies leaking of fluid.   The following portions of the patient's history were reviewed and updated as appropriate: allergies, current medications, past family history, past medical history, past social history, past surgical history and problem list. Problem list updated.  Objective:   Vitals:   05/05/20 1033  BP: 99/64  Pulse: 71  Weight: 150 lb (68 kg)    Fetal Status: Fetal Heart Rate (bpm): 120   Movement: Present     General:  Alert, oriented and cooperative. Patient is in no acute distress.  Skin: Skin is warm and dry. No rash noted.   Cardiovascular: Normal heart rate noted  Respiratory: Normal respiratory effort, no problems with respiration noted  Abdomen: Soft, gravid, appropriate for gestational age.  Pain/Pressure: Absent     Pelvic: Cervical exam deferred        Extremities: Normal range of motion.     Mental Status:  Normal mood and affect. Normal behavior. Normal judgment and thought content.   Assessment and Plan:  Pregnancy: G2P1001 at [redacted]w[redacted]d  1. LGSIL on Pap smear of cervix   2. Supervision of other normal pregnancy, antepartum Pt is interested in possibly getting the covid vaccine, reviewed ACOG statement with patient and she may receive when she is ready.  Preterm labor symptoms and general obstetric precautions including but not limited to vaginal bleeding, contractions, leaking of fluid and fetal movement were  reviewed in detail with the patient.  Please refer to After Visit Summary for other counseling recommendations.   Return in about 2 weeks (around 05/19/2020) for ROB, in person.   Mariel Aloe, MD

## 2020-05-05 NOTE — Patient Instructions (Signed)

## 2020-05-19 ENCOUNTER — Other Ambulatory Visit: Payer: Self-pay

## 2020-05-19 ENCOUNTER — Ambulatory Visit (INDEPENDENT_AMBULATORY_CARE_PROVIDER_SITE_OTHER): Payer: Medicaid Other

## 2020-05-19 VITALS — BP 98/62 | HR 72 | Wt 152.0 lb

## 2020-05-19 DIAGNOSIS — Z3A33 33 weeks gestation of pregnancy: Secondary | ICD-10-CM

## 2020-05-19 DIAGNOSIS — O2613 Low weight gain in pregnancy, third trimester: Secondary | ICD-10-CM

## 2020-05-19 DIAGNOSIS — L731 Pseudofolliculitis barbae: Secondary | ICD-10-CM

## 2020-05-19 DIAGNOSIS — Z3483 Encounter for supervision of other normal pregnancy, third trimester: Secondary | ICD-10-CM

## 2020-05-19 DIAGNOSIS — Z3480 Encounter for supervision of other normal pregnancy, unspecified trimester: Secondary | ICD-10-CM

## 2020-05-19 DIAGNOSIS — F439 Reaction to severe stress, unspecified: Secondary | ICD-10-CM

## 2020-05-19 NOTE — Patient Instructions (Addendum)
Managing Stress, Adult Feeling a certain amount of stress is normal. Stress helps our body and mind get ready to deal with the demands of life. Stress hormones can motivate you to do well at work and meet your responsibilities. However severe or long-lasting (chronic) stress can affect your mental and physical health. Chronic stress puts you at higher risk for anxiety, depression, and other health problems like digestive problems, muscle aches, heart disease, high blood pressure, and stroke. What are the causes? Common causes of stress include:  Demands from work, such as deadlines, feeling overworked, or having long hours.  Pressures at home, such as money issues, disagreements with a spouse, or parenting issues.  Pressures from major life changes, such as divorce, moving, loss of a loved one, or chronic illness. You may be at higher risk for stress-related problems if you do not get enough sleep, are in poor health, do not have emotional support, or have a mental health disorder like anxiety or depression. How to recognize stress Stress can make you:  Have trouble sleeping.  Feel sad, anxious, irritable, or overwhelmed.  Lose your appetite.  Overeat or want to eat unhealthy foods.  Want to use drugs or alcohol. Stress can also cause physical symptoms, such as:  Sore, tense muscles, especially in the shoulders and neck.  Headaches.  Trouble breathing.  A faster heart rate.  Stomach pain, nausea, or vomiting.  Diarrhea or constipation.  Trouble concentrating. Follow these instructions at home: Lifestyle  Identify the source of your stress and your reaction to it. See a therapist who can help you change your reactions.  When there are stressful events: ? Talk about it with family, friends, or co-workers. ? Try to think realistically about stressful events and not ignore them or overreact. ? Try to find the positives in a stressful situation and not focus on the  negatives. ? Cut back on responsibilities at work and home, if possible. Ask for help from friends or family members if you need it.  Find ways to cope with stress, such as: ? Meditation. ? Deep breathing. ? Yoga or tai chi. ? Progressive muscle relaxation. ? Doing art, playing music, or reading. ? Making time for fun activities. ? Spending time with family and friends.  Get support from family, friends, or spiritual resources. Eating and drinking  Eat a healthy diet. This includes: ? Eating foods that are high in fiber, such as beans, whole grains, and fresh fruits and vegetables. ? Limiting foods that are high in fat and processed sugars, such as fried and sweet foods.  Do not skip meals or overeat.  Drink enough fluid to keep your urine pale yellow. Alcohol use  Do not drink alcohol if: ? Your health care provider tells you not to drink. ? You are pregnant, may be pregnant, or are planning to become pregnant.  Drinking alcohol is a way some people try to ease their stress. This can be dangerous, so if you drink alcohol: ? Limit how much you use to:  0-1 drink a day for women.  0-2 drinks a day for men. ? Be aware of how much alcohol is in your drink. In the [place] one drink equals one 12 oz bottle of beer (355 mL), one 5 oz glass of wine (148 mL), or one 1 oz glass of hard liquor (44 mL). Activity   Include 30 minutes of exercise in your daily schedule. Exercise is a good stress reducer.  Include time in your day   for an activity that you find relaxing. Try taking a walk, going on a bike ride, reading a book, or listening to music.  Schedule your time in a way that lowers stress, and keep a consistent schedule. Prioritize what is most important to get done. General instructions  Get enough sleep. Try to go to sleep and get up at about the same time every day.  Take over-the-counter and prescription medicines only as told by your health care provider.  Do not use any  products that contain nicotine or tobacco, such as cigarettes, e-cigarettes, and chewing tobacco. If you need help quitting, ask your health care provider.  Do not use drugs or smoke to cope with stress.  Keep all follow-up visits as told by your health care provider. This is important. Where to find support  Talk with your health care provider about stress management or finding a support group.  Find a therapist to work with you on your stress management techniques. Contact a health care provider if:  Your stress symptoms get worse.  You are unable to manage your stress at home.  You are struggling to stop using drugs or alcohol. Get help right away if:  You may be a danger to yourself or others.  You have any thoughts of death or suicide. If you ever feel like you may hurt yourself or others, or have thoughts about taking your own life, get help right away. You can go to your nearest emergency department or call:  Your local emergency services (911 in the [place]  A suicide crisis helpline, such as the [facility] at 760-571-7732. This is open 24 hours a day. Summary  Feeling a certain amount of stress is normal, but severe or long-lasting (chronic) stress can affect your mental and physical health.  Chronic stress can put you at higher risk for anxiety, depression, and other health problems like digestive problems, muscle aches, heart disease, high blood pressure, and stroke.  You may be at higher risk for stress-related problems if you do not get enough sleep, are in poor health, lack emotional support, or have a mental health disorder like anxiety or depression.  Identify the source of your stress and your reaction to it. Try talking about stressful events with family, friends, or co-workers, finding a coping method, or getting support from spiritual resources.  If you need more help, talk with your health care provider about finding a support group  or a mental health therapist. This information is not intended to replace advice given to you by your health care provider. Make sure you discuss any questions you have with your health care provider. Document Revised: 05/08/2019 Document Reviewed: 05/08/2019 Elsevier Patient Education  Jeanette Becker  An ingrown Becker is a Becker that curls and re-enters the skin instead of growing straight out of the skin. An ingrown Becker can develop in any part of the skin that Becker is removed from. An ingrown Becker may cause small pockets of infection. What are the causes? An ingrown Becker may be caused by:  Shaving.  Tweezing.  Waxing.  Using a Becker removal cream. What increases the risk? You are more likely to develop this condition if you have curly Becker. What are the signs or symptoms? Symptoms of an ingrown Becker may include:  Small bumps on the skin. The bumps may be filled with pus.  Pain.  Itching. How is this diagnosed? An ingrown Becker is diagnosed by a skin  exam. How is this treated? Treatment is often not needed unless the ingrown Becker has caused an infection. If needed, treatment may include:  Applying prescription creams to the skin. This can help with inflammation.  Applying warm compresses to the skin. This can help soften the skin.  Taking antibiotic medicine. An antibiotic may be prescribed if the infection is severe.  Retracting and releasing the ingrown Becker tips. Follow these instructions at home:  Medicines  Take, apply, or use over-the-counter and prescription medicines only as told by your health care provider. This includes any prescription creams.  If you were prescribed an antibiotic medicine, take it as told by your health care provider. Do not stop using the antibiotic even if you start to feel better. General instructions  Do not shave irritated areas of skin. You may start shaving these areas again once the irritation has gone away.  To  help remove ingrown hairs on your face, you may use a facial sponge in a gentle circular motion.  Do not pick or squeeze the irritated area of skin as this may cause infection and scarring. Managing pain and swelling  If directed, apply heat to the affected area as often as told by your health care provider. Use the heat source that your health care provider recommends, such as a moist heat pack or a heating pad. ? Place a towel between your skin and the heat source. ? Leave the heat on for 20-30 minutes. ? Remove the heat if your skin turns bright red. This is especially important if you are unable to feel pain, heat, or cold. You may have a greater risk of getting burned. How is this prevented?  Shower before shaving.  Wrap areas that you are going to shave in warm, moist wraps for several minutes before shaving. The warmth and moisture help to soften the hairs and makes ingrown hairs less likely.  Use thick shaving gels.  Use a razor that cuts Becker slightly above your skin, or use an electric shaver with a long shave setting.  Shave in the direction of Becker growth.  Avoid making multiple razor strokes.  Apply a moisturizing lotion after shaving. Summary  An ingrown Becker is a Becker that curls and re-enters the skin instead of growing straight out of the skin.  Treatment is often not needed unless the ingrown Becker has caused an infection.  Take, apply, or use over-the-counter and prescription medicines only as told by your health care provider. This includes any prescription creams.  Do not shave irritated areas of skin. You may start shaving these areas again once the irritation has gone away.  If directed, apply heat to the affected area. Use the heat source that your health care provider recommends, such as a moist heat pack or a heating pad. This information is not intended to replace advice given to you by your health care provider. Make sure you discuss any questions you have  with your health care provider. Document Revised: 04/18/2018 Document Reviewed: 04/18/2018 Elsevier Patient Education  [place].

## 2020-05-19 NOTE — Progress Notes (Signed)
ROB    CC: Vaginal discharge w/odor. Swab declined today.

## 2020-05-19 NOTE — Progress Notes (Signed)
LOW-RISK PREGNANCY OFFICE VISIT  Patient name: Jeanette Becker MRN [id number]  Date of birth: 1991/06/15 Chief Complaint:   Routine Prenatal Visit  Subjective:   Jeanette Becker is a 29 y.o. G45P1001 female at [redacted]w[redacted]d with an Estimated Date of Delivery: 07/03/20 being seen today for ongoing management of a low-risk pregnancy aeb has Supervision of other normal pregnancy, antepartum; LGSIL on Pap smear of cervix; and Chlamydia infection affecting pregnancy in first trimester on their problem list.  Patient presents today with complaint of vaginal irritation.  She states she has had a recent outbreak of vaginal bumps since receiving a brazillian wax one month ago.  Patient report she looked and felt that it was just ingrown hairs, however, she is concerned that it may be something else.  Patient denies pain, burning, or itching in the vaginal area.  Patient reports normal vaginal discharge.  Patient denies other vaginal concerns including abnormal discharge, leaking of fluid, and bleeding.  Contractions: Not present. Vag. Bleeding: None.  Movement: Present.  Patient requests note stating that she needs to work first shift as she is currently working 2nd shift and her job has openings for first shift.  Patient goes on to state that she is having trouble going to sleep at night. Patient also reports she has been dealing with a custody battle over her first child as well as stress in her current relationship.    Reviewed past medical,surgical, social, obstetrical and family history as well as problem list, medications and allergies.  Objective   Vitals:   05/19/20 1105  BP: (!) 98/62  Pulse: 72  Weight: 152 lb (68.9 kg)  Body mass index is 29.69 kg/m.  Total Weight Gain:-6 lb (-2.722 kg)         Physical Examination:   General appearance: Well appearing, and in no distress  Mental status: Alert, oriented to person, place, and time  Skin: Warm & dry  Cardiovascular: Normal heart  rate noted  Respiratory: Normal respiratory effort, no distress  Abdomen: Soft, gravid, nontender, AGA with Fundal height of Fundal Height: 34 cm  Pelvic: Cervical exam deferred    Visual Exam: Several ingrown hairs noted on symphysis pubis.  Some with mild inflammation, but no pus or discharge noted.  Nontender    Extremities: Edema: Trace  Fetal Status: Fetal Heart Rate (bpm): 145  Movement: Present   No results found for this or any previous visit (from the past 24 hour(s)).  Assessment & Plan:  Low-risk pregnancy of a 29 y.o., G2P1001 at [redacted]w[redacted]d with an Estimated Date of Delivery: 07/03/20   1. Supervision of other normal pregnancy, antepartum -Informed that no note would be given to be switched to different shift.  Encouraged to speak with management esp if position/hours are available. -Anticipatory guidance for upcoming appts.  2. Ingrown hair -Reassured that findings are only ingrown hairs. -Instructed to discontinue [place] Wax. -Advised keeping area clean and dry.  Instructed not to exfoliate area!  3. Insufficient weight gain during pregnancy in third trimester -Discussed weight loss of 6lbs. -Reviewed eating habits which patient reports as salads, fruits, chicken -Discussed how stress can cause decrease in appetite.  4. Stress at home -Informed of resources available to speak with someone regarding stress. -Given information sheet about coping with stress.     Meds: No orders of the defined types were placed in this encounter.  Labs/procedures today:  Lab Orders  No laboratory test(s) ordered today     Reviewed: Preterm labor  symptoms and general obstetric precautions including but not limited to vaginal bleeding, contractions, leaking of fluid and fetal movement were reviewed in detail with the patient.  All questions were answered.  Follow-up: Return in about 3 weeks (around 06/09/2020) for LROB with GBS.  No orders of the defined types were placed in this  encounter.  Cherre Robins MSN, CNM 05/19/2020

## 2020-05-25 DIAGNOSIS — Z3493 Encounter for supervision of normal pregnancy, unspecified, third trimester: Secondary | ICD-10-CM

## 2020-06-04 ENCOUNTER — Encounter (HOSPITAL_COMMUNITY): Payer: Self-pay | Admitting: Obstetrics & Gynecology

## 2020-06-04 ENCOUNTER — Other Ambulatory Visit: Payer: Self-pay

## 2020-06-04 ENCOUNTER — Inpatient Hospital Stay (HOSPITAL_COMMUNITY)
Admission: AD | Admit: 2020-06-04 | Discharge: 2020-06-04 | Disposition: A | Discharge status: Home / Self Care | Payer: Medicaid Other | Attending: Obstetrics & Gynecology | Admitting: Obstetrics & Gynecology

## 2020-06-04 DIAGNOSIS — O368130 Decreased fetal movements, third trimester, not applicable or unspecified: Secondary | ICD-10-CM | POA: Insufficient documentation

## 2020-06-04 DIAGNOSIS — R109 Unspecified abdominal pain: Secondary | ICD-10-CM | POA: Insufficient documentation

## 2020-06-04 DIAGNOSIS — O26893 Other specified pregnancy related conditions, third trimester: Secondary | ICD-10-CM | POA: Diagnosis not present

## 2020-06-04 DIAGNOSIS — Z3A35 35 weeks gestation of pregnancy: Secondary | ICD-10-CM | POA: Insufficient documentation

## 2020-06-04 DIAGNOSIS — R82998 Other abnormal findings in urine: Secondary | ICD-10-CM

## 2020-06-04 DIAGNOSIS — O479 False labor, unspecified: Secondary | ICD-10-CM

## 2020-06-04 LAB — URINALYSIS, ROUTINE W REFLEX MICROSCOPIC
Bacteria, UA: NONE SEEN
Bilirubin Urine: NEGATIVE
Glucose, UA: 150 mg/dL — AB
Hgb urine dipstick: NEGATIVE
Ketones, ur: 5 mg/dL — AB
Nitrite: NEGATIVE
Protein, ur: NEGATIVE mg/dL
Specific Gravity, Urine: 1.021 (ref 1.005–1.030)
pH: 6 (ref 5.0–8.0)

## 2020-06-04 LAB — WET PREP, GENITAL
Sperm: NONE SEEN
Trich, Wet Prep: NONE SEEN
Yeast Wet Prep HPF POC: NONE SEEN

## 2020-06-04 MED ORDER — ACETAMINOPHEN 500 MG PO TABS
1000.0000 mg | ORAL_TABLET | Freq: Once | ORAL | Status: AC
  Administered 2020-06-04: 1000 mg via ORAL
  Filled 2020-06-04: qty 2

## 2020-06-04 MED ORDER — CYCLOBENZAPRINE HCL 5 MG PO TABS
10.0000 mg | ORAL_TABLET | Freq: Once | ORAL | Status: AC
  Administered 2020-06-04: 10 mg via ORAL
  Filled 2020-06-04: qty 2

## 2020-06-04 MED ORDER — LACTATED RINGERS IV BOLUS
1000.0000 mL | Freq: Once | INTRAVENOUS | Status: AC
  Administered 2020-06-04: 1000 mL via INTRAVENOUS

## 2020-06-04 NOTE — Discharge Instructions (Signed)
Braxton Hicks Contractions °Contractions of the uterus can occur throughout pregnancy, but they are not always a sign that you are in labor. You may have practice contractions called Braxton Hicks contractions. These false labor contractions are sometimes confused with true labor. °What are Braxton Hicks contractions? °Braxton Hicks contractions are tightening movements that occur in the muscles of the uterus before labor. Unlike true labor contractions, these contractions do not result in opening (dilation) and thinning of the cervix. Toward the end of pregnancy (32-34 weeks), Braxton Hicks contractions can happen more often and may become stronger. These contractions are sometimes difficult to tell apart from true labor because they can be very uncomfortable. You should not feel embarrassed if you go to the hospital with false labor. °Sometimes, the only way to tell if you are in true labor is for your health care provider to look for changes in the cervix. The health care provider will do a physical exam and may monitor your contractions. If you are not in true labor, the exam should show that your cervix is not dilating and your water has not broken. °If there are no other health problems associated with your pregnancy, it is completely safe for you to be sent home with false labor. You may continue to have Braxton Hicks contractions until you go into true labor. °How to tell the difference between true labor and false labor °True labor °· Contractions last 30-70 seconds. °· Contractions become very regular. °· Discomfort is usually felt in the top of the uterus, and it spreads to the lower abdomen and low back. °· Contractions do not go away with walking. °· Contractions usually become more intense and increase in frequency. °· The cervix dilates and gets thinner. °False labor °· Contractions are usually shorter and not as strong as true labor contractions. °· Contractions are usually irregular. °· Contractions  are often felt in the front of the lower abdomen and in the groin. °· Contractions may go away when you walk around or change positions while lying down. °· Contractions get weaker and are shorter-lasting as time goes on. °· The cervix usually does not dilate or become thin. °Follow these instructions at home: ° °· Take over-the-counter and prescription medicines only as told by your health care provider. °· Keep up with your usual exercises and follow other instructions from your health care provider. °· Eat and drink lightly if you think you are going into labor. °· If Braxton Hicks contractions are making you uncomfortable: °? Change your position from lying down or resting to walking, or change from walking to resting. °? Sit and rest in a tub of warm water. °? Drink enough fluid to keep your urine pale yellow. Dehydration may cause these contractions. °? Do slow and deep breathing several times an hour. °· Keep all follow-up prenatal visits as told by your health care provider. This is important. °Contact a health care provider if: °· You have a fever. °· You have continuous pain in your abdomen. °Get help right away if: °· Your contractions become stronger, more regular, and closer together. °· You have fluid leaking or gushing from your vagina. °· You pass blood-tinged mucus (bloody show). °· You have bleeding from your vagina. °· You have low back pain that you never had before. °· You feel your baby’s head pushing down and causing pelvic pressure. °· Your baby is not moving inside you as much as it used to. °Summary °· Contractions that occur before labor are   called Braxton Hicks contractions, false labor, or practice contractions. °· Braxton Hicks contractions are usually shorter, weaker, farther apart, and less regular than true labor contractions. True labor contractions usually become progressively stronger and regular, and they become more frequent. °· Manage discomfort from Braxton Hicks contractions  by changing position, resting in a warm bath, drinking plenty of water, or practicing deep breathing. °This information is not intended to replace advice given to you by your health care provider. Make sure you discuss any questions you have with your health care provider. °Document Revised: 09/22/2017 Document Reviewed: 02/23/2017 °Elsevier Patient Education © 2020 Elsevier Inc. ° °

## 2020-06-04 NOTE — MAU Note (Signed)
Pt reports to MAU stating every 5-40mins she is feeling sharp abdominal cramps that she rates 4-5/10 on the pain scale. No bleeding or LOF. DFM x2 days. Pt reports feeling some movement just not as much as normal.

## 2020-06-04 NOTE — MAU Provider Note (Signed)
History     CSN: [id number]  Arrival date and time: 06/04/20 0865   First Provider Initiated Contact with Patient 06/04/20 2004      Chief Complaint  Patient presents with  . Contractions  . Decreased Fetal Movement   HPI Jeanette Becker is a 29 y.o. G2P1001 at [redacted]w[redacted]d who presents to MAU with chief complaints of abdominal cramping and decreased fetal movement. She denies vaginal bleeding, abdominal tenderness, leaking of fluid, dysuria, fever, falls, or recent illness.   Abdominal Cramping This is a new problem, onset this morning (06/04/2020). Patient endorses mild "sharp" cramps in the middle of her lower abdomen ever 5-10 minutes. She rates her pain as 4-5/10 when it occurs. Her pain is associated with movement, less discomfort at rest. She has not taken medication or tried other treatments for this complaint.   DFM This is a recurrent problem, onset two days ago. Patient endorses regular movement "but not as much as usual". She reports active fetal movement on arrival and throughout evaluation in MAU.  Patient receives care with [facility] and her next appointment is 06/09/2020.  OB History    Gravida  2   Para  1   Term  1   Preterm      AB      Living  1     SAB      TAB      Ectopic      Multiple      Live Births  1           Past Medical History:  Diagnosis Date  . Medical history non-contributory     Past Surgical History:  Procedure Laterality Date  . NO PAST SURGERIES      History reviewed. No pertinent family history.  Social History   Tobacco Use  . Smoking status: Never Smoker  . Smokeless tobacco: Never Used  Vaping Use  . Vaping Use: Never used  Substance Use Topics  . Alcohol use: No  . Drug use: No    Allergies: No Known Allergies  No medications prior to admission.    Review of Systems  Gastrointestinal: Positive for abdominal pain.  Genitourinary: Negative for dysuria, vaginal bleeding and vaginal  discharge.  Musculoskeletal: Negative for back pain.  All other systems reviewed and are negative.  Physical Exam   Blood pressure (!) 103/52, pulse 83, temperature 98 F (36.7 C), temperature source Oral, resp. rate 16, last menstrual period 09/27/2019.  Physical Exam Vitals and nursing note reviewed. Exam conducted with a chaperone present.  Cardiovascular:     Rate and Rhythm: Normal rate.     Pulses: Normal pulses.  Pulmonary:     Effort: Pulmonary effort is normal.     Breath sounds: Normal breath sounds.  Abdominal:     Comments: Gravid  Skin:    General: Skin is warm.     Capillary Refill: Capillary refill takes less than 2 seconds.  Neurological:     Mental Status: She is alert.  Psychiatric:        Mood and Affect: Mood normal.     MAU Course  Procedures  --Abnormal UA, no patient report of urinary symptoms. Discussed initiating abx vs waiting for culture. Patient verbalizes preference for sending urine culture. --Reactive tracing: baseline 120, mod variability, positive accels, no decels --Toco: uterine irritability --Closed cervix --OB history term SVD in 2009  --Hx Chlamydia this pregnancy, negative TOC. Will collect vaginal swabs --Clue cells not consistent with  ROS or physical exam. Will defer treatment  Patient Vitals for the past 24 hrs:  BP Temp Temp src Pulse Resp  06/04/20 1936 (!) 103/52 98 F (36.7 C) Oral 83 16   Orders Placed This Encounter  Procedures  . Wet prep, genital    Standing Status:   Standing    Number of Occurrences:   1  . Culture, OB Urine    Standing Status:   Standing    Number of Occurrences:   1  . Urinalysis, Routine w reflex microscopic Urine, Clean Catch    Standing Status:   Standing    Number of Occurrences:   1  . Insert peripheral IV    Standing Status:   Standing    Number of Occurrences:   1   Meds ordered this encounter  Medications  . acetaminophen (TYLENOL) tablet 1,000 mg  . lactated ringers bolus  1,000 mL  . cyclobenzaprine (FLEXERIL) tablet 10 mg   Results for orders placed or performed during the hospital encounter of 06/04/20 (from the past 24 hour(s))  Urinalysis, Routine w reflex microscopic Urine, Clean Catch     Status: Abnormal   Collection Time: 06/04/20  7:42 PM  Result Value Ref Range   Color, Urine YELLOW YELLOW   APPearance HAZY (A) CLEAR   Specific Gravity, Urine 1.021 1.005 - 1.030   pH 6.0 5.0 - 8.0   Glucose, UA 150 (A) NEGATIVE mg/dL   Hgb urine dipstick NEGATIVE NEGATIVE   Bilirubin Urine NEGATIVE NEGATIVE   Ketones, ur 5 (A) NEGATIVE mg/dL   Protein, ur NEGATIVE NEGATIVE mg/dL   Nitrite NEGATIVE NEGATIVE   Leukocytes,Ua MODERATE (A) NEGATIVE   RBC / HPF 0-5 0 - 5 RBC/hpf   WBC, UA 0-5 0 - 5 WBC/hpf   Bacteria, UA NONE SEEN NONE SEEN   Squamous Epithelial / LPF 6-10 0 - 5   Mucus PRESENT   Wet prep, genital     Status: Abnormal   Collection Time: 06/04/20  8:21 PM   Specimen: PATH Cytology Cervicovaginal Ancillary Only  Result Value Ref Range   Yeast Wet Prep HPF POC NONE SEEN NONE SEEN   Trich, Wet Prep NONE SEEN NONE SEEN   Clue Cells Wet Prep HPF POC PRESENT (A) NONE SEEN   WBC, Wet Prep HPF POC MANY (A) NONE SEEN   Sperm NONE SEEN     Assessment and Plan  --29 y.o. G2P1001 at [redacted]w[redacted]d  --Reactive tracing, closed cervix --Patient pushed NST button 35 times in 90 minutes --Urine culture in work --Patient denies pain prior to discharge --Discharge home in stable condition  F/U: --Next appt with [facility] is 06/09/2020  Calvert Cantor, CNM 06/04/2020, 11:14 PM

## 2020-06-05 LAB — CULTURE, OB URINE: Culture: NO GROWTH

## 2020-06-05 LAB — GC/CHLAMYDIA PROBE AMP (~~LOC~~) NOT AT ARMC
Chlamydia: NEGATIVE
Comment: NEGATIVE
Comment: NORMAL
Neisseria Gonorrhea: NEGATIVE

## 2020-06-09 ENCOUNTER — Other Ambulatory Visit: Payer: Self-pay

## 2020-06-09 ENCOUNTER — Ambulatory Visit (INDEPENDENT_AMBULATORY_CARE_PROVIDER_SITE_OTHER): Payer: Medicaid Other | Admitting: Obstetrics and Gynecology

## 2020-06-09 VITALS — BP 106/69 | HR 85 | Wt 158.7 lb

## 2020-06-09 DIAGNOSIS — A749 Chlamydial infection, unspecified: Secondary | ICD-10-CM

## 2020-06-09 DIAGNOSIS — O98811 Other maternal infectious and parasitic diseases complicating pregnancy, first trimester: Secondary | ICD-10-CM

## 2020-06-09 DIAGNOSIS — Z3A36 36 weeks gestation of pregnancy: Secondary | ICD-10-CM

## 2020-06-09 DIAGNOSIS — Z3480 Encounter for supervision of other normal pregnancy, unspecified trimester: Secondary | ICD-10-CM

## 2020-06-09 NOTE — Progress Notes (Signed)
Pt presents for GBS  GC/CT negative 06/04/20

## 2020-06-09 NOTE — Progress Notes (Signed)
° °  PRENATAL VISIT NOTE  Subjective:  Jeanette Becker is a 29 y.o. G2P1001 at [redacted]w[redacted]d being seen today for ongoing prenatal care.  She is currently monitored for the following issues for this low-risk pregnancy and has Supervision of other normal pregnancy, antepartum; LGSIL on Pap smear of cervix; Chlamydia infection affecting pregnancy in first trimester; and [redacted] weeks gestation of pregnancy on their problem list.  Patient doing well with no acute concerns today. She reports left sided sciatica.  Contractions: Not present. Vag. Bleeding: None.  Movement: Present. Denies leaking of fluid.   The following portions of the patient's history were reviewed and updated as appropriate: allergies, current medications, past family history, past medical history, past social history, past surgical history and problem list. Problem list updated.  Objective:   Vitals:   06/09/20 1128  BP: 106/69  Pulse: 85  Weight: 158 lb 11.2 oz (72 kg)    Fetal Status: Fetal Heart Rate (bpm): 148 Fundal Height: 37 cm Movement: Present  Presentation: Vertex  General:  Alert, oriented and cooperative. Patient is in no acute distress.  Skin: Skin is warm and dry. No rash noted.   Cardiovascular: Normal heart rate noted  Respiratory: Normal respiratory effort, no problems with respiration noted  Abdomen: Soft, gravid, appropriate for gestational age.  Pain/Pressure: Absent     Pelvic: Cervical exam performed Dilation: 1 Effacement (%): 50 Station: -2  Extremities: Normal range of motion.  Edema: Trace  Mental Status:  Normal mood and affect. Normal behavior. Normal judgment and thought content.   Assessment and Plan:  Pregnancy: G2P1001 at [redacted]w[redacted]d  1. Supervision of other normal pregnancy, antepartum Pt doing well, continue routine PNC, pt may want IOL at 39-40 weeks - Culture, beta strep (group b only)  2. Chlamydia infection affecting pregnancy in first trimester Recent TOC neg GC/C  3. [redacted] weeks gestation  of pregnancy   Term labor symptoms and general obstetric precautions including but not limited to vaginal bleeding, contractions, leaking of fluid and fetal movement were reviewed in detail with the patient.  Please refer to After Visit Summary for other counseling recommendations.   Return in about 1 week (around 06/16/2020) for ROB, in person.   Mariel Aloe, MD

## 2020-06-09 NOTE — Patient Instructions (Signed)

## 2020-06-13 LAB — CULTURE, BETA STREP (GROUP B ONLY): Strep Gp B Culture: NEGATIVE

## 2020-06-16 ENCOUNTER — Encounter (HOSPITAL_COMMUNITY): Payer: Self-pay | Admitting: Family Medicine

## 2020-06-16 ENCOUNTER — Ambulatory Visit (INDEPENDENT_AMBULATORY_CARE_PROVIDER_SITE_OTHER): Payer: Medicaid Other | Admitting: Advanced Practice Midwife

## 2020-06-16 ENCOUNTER — Encounter: Payer: Self-pay | Admitting: Advanced Practice Midwife

## 2020-06-16 ENCOUNTER — Inpatient Hospital Stay (EMERGENCY_DEPARTMENT_HOSPITAL)
Admission: AD | Admit: 2020-06-16 | Discharge: 2020-06-16 | Disposition: A | Discharge status: Home / Self Care | Payer: Medicaid Other | Source: Home / Self Care | Attending: Family Medicine | Admitting: Family Medicine

## 2020-06-16 ENCOUNTER — Other Ambulatory Visit: Payer: Self-pay

## 2020-06-16 VITALS — BP 106/65 | HR 87 | Wt 156.0 lb

## 2020-06-16 DIAGNOSIS — M5430 Sciatica, unspecified side: Secondary | ICD-10-CM

## 2020-06-16 DIAGNOSIS — Z3A37 37 weeks gestation of pregnancy: Secondary | ICD-10-CM

## 2020-06-16 DIAGNOSIS — O471 False labor at or after 37 completed weeks of gestation: Secondary | ICD-10-CM

## 2020-06-16 DIAGNOSIS — O99891 Other specified diseases and conditions complicating pregnancy: Secondary | ICD-10-CM

## 2020-06-16 DIAGNOSIS — M549 Dorsalgia, unspecified: Secondary | ICD-10-CM

## 2020-06-16 DIAGNOSIS — Z3480 Encounter for supervision of other normal pregnancy, unspecified trimester: Secondary | ICD-10-CM

## 2020-06-16 LAB — POCT FERN TEST: POCT Fern Test: NEGATIVE

## 2020-06-16 LAB — AMNISURE RUPTURE OF MEMBRANE (ROM) NOT AT ARMC: Amnisure ROM: NEGATIVE

## 2020-06-16 MED ORDER — COMFORT FIT MATERNITY SUPP MED MISC: 1.0000 | Freq: Every day | 0 refills | Status: DC

## 2020-06-16 NOTE — Patient Instructions (Signed)
Things to Try After 37 weeks to Encourage Labor/Get Ready for Labor:   1.  Try the Miles Circuit at www.milescircuit.com daily to improve baby's position and encourage the onset of labor.  2. Walk a little and rest a little every day.  Change positions often.  3. Cervical Ripening: May try one or both a. Red Raspberry Leaf capsules or tea:  two 300mg or 400mg tablets with each meal, 2-3 times a day, or 1-3 cups of tea daily  Potential Side Effects Of Raspberry Leaf:  Most women do not experience any side effects from drinking raspberry leaf tea. However, nausea and loose stools are possible   b. Evening Primrose Oil capsules: may take 1 to 3 capsules daily. Take 1-2 capsules by mouth each day and place one capsule vaginally at night.  You may also prick the vaginal capsule to release the oil prior to inserting in the vagina. Some of the potential side effects:  Upset stomach  Loose stools or diarrhea  Headaches  Nausea  4. Sex (and especially sex with orgasm) can also help the cervix ripen and encourage labor onset.  Labor Precautions Reasons to come to MAU at [facility]:  1.  Contractions are  5 minutes apart or less, each last 1 minute, these have been going on for 1-2 hours, and you cannot walk or talk during them 2.  You have a large gush of fluid, or a trickle of fluid that will not stop and you have to wear a pad 3.  You have bleeding that is bright red, heavier than spotting--like menstrual bleeding (spotting can be normal in early labor or after a check of your cervix) 4.  You do not feel the baby moving like he/she normally does 

## 2020-06-16 NOTE — Progress Notes (Signed)
ROB GBS: Negative on 06/09/20 GC/CT Neg on 06/04/20  CC: Back Pain, Sciatica , pt has been missing work due to back pain.  Pt request cervix check.

## 2020-06-16 NOTE — MAU Note (Signed)
Jeanette Becker is a 29 y.o. at [redacted]w[redacted]d here in MAU reporting: contractions that started about an hour after her appointment. They are now about every 5 minutes. States no bleeding but did have some fluid that came out after her appointment. DFM.  Onset of complaint: today  Pain score: 6/10  Vitals:   06/16/20 1753  BP: 124/69  Pulse: (!) 131  Resp: 16  Temp: 98.6 F (37 C)  SpO2: 99%     FHT:167  Lab orders placed from triage: none

## 2020-06-16 NOTE — Discharge Instructions (Signed)
Vaginal delivery means that you give birth by pushing your baby out of your birth canal (vagina). A team of health care providers will help you before, during, and after vaginal delivery. Birth experiences are unique for every woman and every pregnancy, and birth experiences vary depending on where you choose to give birth. What happens when I arrive at the birth center or hospital? Once you are in labor and have been admitted into the hospital or birth center, your health care provider may:  Review your pregnancy history and any concerns that you have.  Insert an IV into one of your veins. This may be used to give you fluids and medicines.  Check your blood pressure, pulse, temperature, and heart rate (vital signs).  Check whether your bag of water (amniotic sac) has broken (ruptured).  Talk with you about your birth plan and discuss pain control options. Monitoring Your health care provider may monitor your contractions (uterine monitoring) and your baby's heart rate (fetal monitoring). You may need to be monitored:  Often, but not continuously (intermittently).  All the time or for long periods at a time (continuously). Continuous monitoring may be needed if: ? You are taking certain medicines, such as medicine to relieve pain or make your contractions stronger. ? You have pregnancy or labor complications. Monitoring may be done by:  Placing a special stethoscope or a handheld monitoring device on your abdomen to check your baby's heartbeat and to check for contractions.  Placing monitors on your abdomen (external monitors) to record your baby's heartbeat and the frequency and length of contractions.  Placing monitors inside your uterus through your vagina (internal monitors) to record your baby's heartbeat and the frequency, length, and strength of your contractions. Depending on the type of monitor, it may remain in your uterus or on your baby's head until birth.  Telemetry. This  is a type of continuous monitoring that can be done with external or internal monitors. Instead of having to stay in bed, you are able to move around during telemetry. Physical exam Your health care provider may perform frequent physical exams. This may include:  Checking how and where your baby is positioned in your uterus.  Checking your cervix to determine: ? Whether it is thinning out (effacing). ? Whether it is opening up (dilating). What happens during labor and delivery?  Normal labor and delivery is divided into the following three stages: Stage 1  This is the longest stage of labor.  This stage can last for hours or days.  Throughout this stage, you will feel contractions. Contractions generally feel mild, infrequent, and irregular at first. They get stronger, more frequent (about every 2-3 minutes), and more regular as you move through this stage.  This stage ends when your cervix is completely dilated to 4 inches (10 cm) and completely effaced. Stage 2  This stage starts once your cervix is completely effaced and dilated and lasts until the delivery of your baby.  This stage may last from 20 minutes to 2 hours.  This is the stage where you will feel an urge to push your baby out of your vagina.  You may feel stretching and burning pain, especially when the widest part of your baby's head passes through the vaginal opening (crowning).  Once your baby is delivered, the umbilical cord will be clamped and cut. This usually occurs after waiting a period of 1-2 minutes after delivery.  Your baby will be placed on your bare chest (skin-to-skin contact) in  an upright position and covered with a warm blanket. Watch your baby for feeding cues, like rooting or sucking, and help the baby to your breast for his or her first feeding. Stage 3  This stage starts immediately after the birth of your baby and ends after you deliver the placenta.  This stage may take anywhere from 5 to  30 minutes.  After your baby has been delivered, you will feel contractions as your body expels the placenta and your uterus contracts to control bleeding. What can I expect after labor and delivery?  After labor is over, you and your baby will be monitored closely until you are ready to go home to ensure that you are both healthy. Your health care team will teach you how to care for yourself and your baby.  You and your baby will stay in the same room (rooming in) during your hospital stay. This will encourage early bonding and successful breastfeeding.  You may continue to receive fluids and medicines through an IV.  Your uterus will be checked and massaged regularly (fundal massage).  You will have some soreness and pain in your abdomen, vagina, and the area of skin between your vaginal opening and your anus (perineum).  If an incision was made near your vagina (episiotomy) or if you had some vaginal tearing during delivery, cold compresses may be placed on your episiotomy or your tear. This helps to reduce pain and swelling.  You may be given a squirt bottle to use instead of wiping when you go to the bathroom. To use the squirt bottle, follow these steps: ? Before you urinate, fill the squirt bottle with warm water. Do not use hot water. ? After you urinate, while you are sitting on the toilet, use the squirt bottle to rinse the area around your urethra and vaginal opening. This rinses away any urine and blood. ? Fill the squirt bottle with clean water every time you use the bathroom.  It is normal to have vaginal bleeding after delivery. Wear a sanitary pad for vaginal bleeding and discharge. Summary  Vaginal delivery means that you will give birth by pushing your baby out of your birth canal (vagina).  Your health care provider may monitor your contractions (uterine monitoring) and your baby's heart rate (fetal monitoring).  Your health care provider may perform a physical  exam.  Normal labor and delivery is divided into three stages.  After labor is over, you and your baby will be monitored closely until you are ready to go home. This information is not intended to replace advice given to you by your health care provider. Make sure you discuss any questions you have with your health care provider. Document Revised: 11/14/2017 Document Reviewed: 11/14/2017 Elsevier Patient Education  2020 [employer].

## 2020-06-16 NOTE — Progress Notes (Signed)
   PRENATAL VISIT NOTE  Subjective:  Jeanette Becker is a 29 y.o. G2P1001 at [redacted]w[redacted]d being seen today for ongoing prenatal care.  She is currently monitored for the following issues for this low-risk pregnancy and has Supervision of other normal pregnancy, antepartum; LGSIL on Pap smear of cervix; Chlamydia infection affecting pregnancy in first trimester; and [redacted] weeks gestation of pregnancy on their problem list.  Patient reports backache and occasional contractions.  Contractions: Irritability. Vag. Bleeding: None.  Movement: Present. Denies leaking of fluid.   The following portions of the patient's history were reviewed and updated as appropriate: allergies, current medications, past family history, past medical history, past social history, past surgical history and problem list.   Objective:   Vitals:   06/16/20 1105  BP: 106/65  Pulse: 87  Weight: 156 lb (70.8 kg)    Fetal Status: Fetal Heart Rate (bpm): 142 Fundal Height: 37 cm Movement: Present  Presentation: Vertex  General:  Alert, oriented and cooperative. Patient is in no acute distress.  Skin: Skin is warm and dry. No rash noted.   Cardiovascular: Normal heart rate noted  Respiratory: Normal respiratory effort, no problems with respiration noted  Abdomen: Soft, gravid, appropriate for gestational age.  Pain/Pressure: Absent     Pelvic: Cervical exam performed in the presence of a chaperone Dilation: 4 Effacement (%): 50 Station: -2  Extremities: Normal range of motion.  Edema: Trace  Mental Status: Normal mood and affect. Normal behavior. Normal judgment and thought content.   Assessment and Plan:  Pregnancy: G2P1001 at [redacted]w[redacted]d 1. Supervision of other normal pregnancy, antepartum --Anticipatory guidance about next visits/weeks of pregnancy given. --Next visit in 1 week --Pt to try the [employer], cervix is 4 cm but no regular contractions today, good fetal movement  2. Back pain affecting pregnancy in third  trimester --Rest/ice/heat/warm bath/Tylenol/pregnancy support belt  - Elastic Bandages & Supports (COMFORT FIT MATERNITY SUPP MED) MISC; 1 Device by Does not apply route daily.  Dispense: 1 each; Refill: 0  3. Sciatica, unspecified laterality --Note for pt to miss work one day last week and if needed until delivery due to sciatica pain. --Support belt, ice, exercises given  4. [redacted] weeks gestation of pregnancy   Term labor symptoms and general obstetric precautions including but not limited to vaginal bleeding, contractions, leaking of fluid and fetal movement were reviewed in detail with the patient. Please refer to After Visit Summary for other counseling recommendations.   Return in about 1 week (around 06/23/2020).  Future Appointments  Date Time Provider Department Center  06/23/2020  9:35 AM Bernerd Limbo, CNM [facility] None    [name], [place]

## 2020-06-16 NOTE — MAU Provider Note (Signed)
First Provider Initiated Contact with Patient 06/16/20 1956       S: Ms. Jeanette Becker is a 29 y.o. G2P1001 at [redacted]w[redacted]d  who presents to MAU today complaining contractions q 5 minutes since 11:00am. She denies vaginal bleeding. She denies LOF. She reports normal fetal movement.    O: BP 124/69 (BP Location: Right Arm)   Pulse (!) 121 Comment: provider notified of increased HR, instructed to hydrate with PO fluids, will continue to monitor  Temp 98.6 F (37 C) (Oral)   Resp 16   LMP 09/27/2019   SpO2 99% Comment: room air GENERAL: Well-developed, well-nourished female in no acute distress.  HEAD: Normocephalic, atraumatic.  CHEST: Normal effort of breathing, regular heart rate ABDOMEN: Soft, nontender, gravid  Cervical exam:  Dilation: 4 Effacement (%): 50 Station: -2 Presentation: Vertex Exam by:: AMariel Sleet, RN   Fetal Monitoring: Baseline: 145 Variability: moderate Accelerations: 15x15 Decelerations: none Contractions: irregular   Results for orders placed or performed during the hospital encounter of 06/16/20 (from the past 24 hour(s))  POCT fern test     Status: None   Collection Time: 06/16/20  6:31 PM  Result Value Ref Range   POCT Fern Test Negative = intact amniotic membranes   Amnisure rupture of membrane (rom)not at [facility]     Status: None   Collection Time: 06/16/20  6:49 PM  Result Value Ref Range   Amnisure ROM NEGATIVE       A: [facility] at [redacted]w[redacted]d  False labor  P: DC home Comfort measures reviewed  3rd Trimester precautions  labor precautions  Fetal kick counts OK:HTXH  Return to MAU as needed FU with OB as planned   Follow-up Information    [name] Follow up.   Contact information: [address] [place] [address] [phone]             Thressa Sheller DNP, CNM  06/16/20  8:07 PM

## 2020-06-17 ENCOUNTER — Other Ambulatory Visit: Payer: Self-pay

## 2020-06-17 ENCOUNTER — Encounter (HOSPITAL_COMMUNITY): Payer: Self-pay | Admitting: Obstetrics and Gynecology

## 2020-06-17 ENCOUNTER — Inpatient Hospital Stay (HOSPITAL_COMMUNITY): Payer: Medicaid Other

## 2020-06-17 ENCOUNTER — Inpatient Hospital Stay (HOSPITAL_COMMUNITY)
Admission: AD | Admit: 2020-06-17 | Discharge: 2020-06-19 | DRG: 831 | Disposition: A | Discharge status: Home / Self Care | Payer: Medicaid Other | Attending: Obstetrics and Gynecology | Admitting: Obstetrics and Gynecology

## 2020-06-17 DIAGNOSIS — Z3A38 38 weeks gestation of pregnancy: Secondary | ICD-10-CM | POA: Diagnosis not present

## 2020-06-17 DIAGNOSIS — O368130 Decreased fetal movements, third trimester, not applicable or unspecified: Secondary | ICD-10-CM | POA: Diagnosis present

## 2020-06-17 DIAGNOSIS — O98519 Other viral diseases complicating pregnancy, unspecified trimester: Secondary | ICD-10-CM | POA: Diagnosis present

## 2020-06-17 DIAGNOSIS — Z3490 Encounter for supervision of normal pregnancy, unspecified, unspecified trimester: Secondary | ICD-10-CM

## 2020-06-17 DIAGNOSIS — J1282 Pneumonia due to coronavirus disease 2019: Secondary | ICD-10-CM | POA: Diagnosis present

## 2020-06-17 DIAGNOSIS — R509 Fever, unspecified: Secondary | ICD-10-CM | POA: Diagnosis present

## 2020-06-17 DIAGNOSIS — O368330 Maternal care for abnormalities of the fetal heart rate or rhythm, third trimester, not applicable or unspecified: Secondary | ICD-10-CM | POA: Diagnosis present

## 2020-06-17 DIAGNOSIS — O471 False labor at or after 37 completed weeks of gestation: Secondary | ICD-10-CM | POA: Diagnosis not present

## 2020-06-17 DIAGNOSIS — Z3A37 37 weeks gestation of pregnancy: Secondary | ICD-10-CM | POA: Diagnosis not present

## 2020-06-17 DIAGNOSIS — O99513 Diseases of the respiratory system complicating pregnancy, third trimester: Secondary | ICD-10-CM | POA: Diagnosis present

## 2020-06-17 DIAGNOSIS — U071 COVID-19: Secondary | ICD-10-CM | POA: Diagnosis present

## 2020-06-17 DIAGNOSIS — O98513 Other viral diseases complicating pregnancy, third trimester: Secondary | ICD-10-CM | POA: Diagnosis present

## 2020-06-17 DIAGNOSIS — I959 Hypotension, unspecified: Secondary | ICD-10-CM

## 2020-06-17 DIAGNOSIS — R7989 Other specified abnormal findings of blood chemistry: Secondary | ICD-10-CM | POA: Diagnosis not present

## 2020-06-17 LAB — URINALYSIS, ROUTINE W REFLEX MICROSCOPIC
Bilirubin Urine: NEGATIVE
Glucose, UA: NEGATIVE mg/dL
Hgb urine dipstick: NEGATIVE
Ketones, ur: 20 mg/dL — AB
Nitrite: NEGATIVE
Protein, ur: NEGATIVE mg/dL
Specific Gravity, Urine: 1.01 (ref 1.005–1.030)
pH: 6 (ref 5.0–8.0)

## 2020-06-17 LAB — TYPE AND SCREEN
ABO/RH(D): O POS
Antibody Screen: NEGATIVE

## 2020-06-17 LAB — COMPREHENSIVE METABOLIC PANEL
ALT: 15 U/L (ref 0–44)
AST: 22 U/L (ref 15–41)
Albumin: 2.7 g/dL — ABNORMAL LOW (ref 3.5–5.0)
Alkaline Phosphatase: 226 U/L — ABNORMAL HIGH (ref 38–126)
Anion gap: 10 (ref 5–15)
BUN: <5 mg/dL — ABNORMAL LOW (ref 6–20)
CO2: 19 mmol/L — ABNORMAL LOW (ref 22–32)
Calcium: 9.1 mg/dL (ref 8.9–10.3)
Chloride: 107 mmol/L (ref 98–111)
Creatinine, Ser: 0.44 mg/dL (ref 0.44–1.00)
GFR calc Af Amer: >60 mL/min (ref >60)
GFR calc non Af Amer: >60 mL/min (ref >60)
Glucose, Bld: 105 mg/dL — ABNORMAL HIGH (ref 70–99)
Potassium: 3.9 mmol/L (ref 3.5–5.1)
Sodium: 136 mmol/L (ref 135–145)
Total Bilirubin: 0.6 mg/dL (ref 0.3–1.2)
Total Protein: 5.7 g/dL — ABNORMAL LOW (ref 6.5–8.1)

## 2020-06-17 LAB — CBC WITH DIFFERENTIAL/PLATELET
Abs Immature Granulocytes: 0.09 10*3/uL — ABNORMAL HIGH (ref 0.00–0.07)
Basophils Absolute: 0 10*3/uL (ref 0.0–0.1)
Basophils Relative: 1 %
Eosinophils Absolute: 0 10*3/uL (ref 0.0–0.5)
Eosinophils Relative: 1 %
HCT: 31.7 % — ABNORMAL LOW (ref 36.0–46.0)
Hemoglobin: 10.2 g/dL — ABNORMAL LOW (ref 12.0–15.0)
Immature Granulocytes: 2 %
Lymphocytes Relative: 12 %
Lymphs Abs: 0.7 10*3/uL (ref 0.7–4.0)
MCH: 29.8 pg (ref 26.0–34.0)
MCHC: 32.2 g/dL (ref 30.0–36.0)
MCV: 92.7 fL (ref 80.0–100.0)
Monocytes Absolute: 0.6 10*3/uL (ref 0.1–1.0)
Monocytes Relative: 11 %
Neutro Abs: 4.2 10*3/uL (ref 1.7–7.7)
Neutrophils Relative %: 73 %
Platelets: 274 10*3/uL (ref 150–400)
RBC: 3.42 MIL/uL — ABNORMAL LOW (ref 3.87–5.11)
RDW: 14.5 % (ref 11.5–15.5)
WBC: 5.6 10*3/uL (ref 4.0–10.5)
nRBC: 0.5 % — ABNORMAL HIGH (ref 0.0–0.2)

## 2020-06-17 LAB — LACTATE DEHYDROGENASE: LDH: 159 U/L (ref 98–192)

## 2020-06-17 LAB — FERRITIN: Ferritin: 11 ng/mL (ref 11–307)

## 2020-06-17 LAB — C-REACTIVE PROTEIN: CRP: 1.1 mg/dL — ABNORMAL HIGH (ref <1.0)

## 2020-06-17 LAB — SARS CORONAVIRUS 2 BY RT PCR (HOSPITAL ORDER, PERFORMED IN ~~LOC~~ HOSPITAL LAB): SARS Coronavirus 2: POSITIVE — AB

## 2020-06-17 LAB — GLUCOSE, CAPILLARY
Glucose-Capillary: 74 mg/dL (ref 70–99)
Glucose-Capillary: 148 mg/dL — ABNORMAL HIGH (ref 70–99)

## 2020-06-17 LAB — LACTIC ACID, PLASMA: Lactic Acid, Venous: 1.3 mmol/L (ref 0.5–1.9)

## 2020-06-17 LAB — FIBRINOGEN: Fibrinogen: 526 mg/dL — ABNORMAL HIGH (ref 210–475)

## 2020-06-17 MED ORDER — SODIUM CHLORIDE 0.9 % IV SOLN
100.0000 mg | Freq: Every day | INTRAVENOUS | Status: DC
  Administered 2020-06-18 – 2020-06-19 (×2): 100 mg via INTRAVENOUS
  Filled 2020-06-17 (×2): qty 100

## 2020-06-17 MED ORDER — ZOLPIDEM TARTRATE 5 MG PO TABS: 5.0000 mg | ORAL_TABLET | Freq: Every evening | ORAL | Status: DC | PRN

## 2020-06-17 MED ORDER — DOCUSATE SODIUM 100 MG PO CAPS: 100.0000 mg | ORAL_CAPSULE | Freq: Two times a day (BID) | ORAL | Status: DC | PRN

## 2020-06-17 MED ORDER — GUAIFENESIN-DM 100-10 MG/5ML PO SYRP
10.0000 mL | ORAL_SOLUTION | ORAL | Status: DC | PRN
  Filled 2020-06-17: qty 10

## 2020-06-17 MED ORDER — ALBUTEROL SULFATE HFA 108 (90 BASE) MCG/ACT IN AERS
2.0000 | INHALATION_SPRAY | Freq: Four times a day (QID) | RESPIRATORY_TRACT | Status: DC
  Administered 2020-06-17 – 2020-06-19 (×8): 2 via RESPIRATORY_TRACT
  Filled 2020-06-17: qty 6.7

## 2020-06-17 MED ORDER — SODIUM CHLORIDE 0.9 % IV SOLN
200.0000 mg | Freq: Once | INTRAVENOUS | Status: AC
  Administered 2020-06-17: 200 mg via INTRAVENOUS
  Filled 2020-06-17: qty 40

## 2020-06-17 MED ORDER — ZINC SULFATE 220 (50 ZN) MG PO CAPS
220.0000 mg | ORAL_CAPSULE | Freq: Every day | ORAL | Status: DC
  Administered 2020-06-17 – 2020-06-19 (×3): 220 mg via ORAL
  Filled 2020-06-17 (×3): qty 1

## 2020-06-17 MED ORDER — ASCORBIC ACID 500 MG PO TABS
500.0000 mg | ORAL_TABLET | Freq: Every day | ORAL | Status: DC
  Administered 2020-06-17 – 2020-06-19 (×3): 500 mg via ORAL
  Filled 2020-06-17 (×3): qty 1

## 2020-06-17 MED ORDER — HYDROCOD POLST-CPM POLST ER 10-8 MG/5ML PO SUER: 5.0000 mL | Freq: Two times a day (BID) | ORAL | Status: DC | PRN

## 2020-06-17 MED ORDER — ENOXAPARIN SODIUM 40 MG/0.4ML ~~LOC~~ SOLN
40.0000 mg | SUBCUTANEOUS | Status: DC
  Administered 2020-06-17: 40 mg via SUBCUTANEOUS
  Filled 2020-06-17: qty 0.4

## 2020-06-17 MED ORDER — ADULT MULTIVITAMIN W/MINERALS CH: 1.0000 | ORAL_TABLET | Freq: Every day | ORAL | Status: DC

## 2020-06-17 MED ORDER — LACTATED RINGERS IV BOLUS
1000.0000 mL | Freq: Once | INTRAVENOUS | Status: AC
  Administered 2020-06-17: 1000 mL via INTRAVENOUS

## 2020-06-17 MED ORDER — INSULIN ASPART 100 UNIT/ML ~~LOC~~ SOLN: 0.0000 [IU] | Freq: Three times a day (TID) | SUBCUTANEOUS | Status: DC

## 2020-06-17 MED ORDER — ACETAMINOPHEN 325 MG PO TABS: 650.0000 mg | ORAL_TABLET | ORAL | Status: DC | PRN

## 2020-06-17 MED ORDER — DOCUSATE SODIUM 100 MG PO CAPS: 100.0000 mg | ORAL_CAPSULE | Freq: Every day | ORAL | Status: DC

## 2020-06-17 MED ORDER — PRENATAL MULTIVITAMIN CH
1.0000 | ORAL_TABLET | Freq: Every day | ORAL | Status: DC
  Administered 2020-06-18 – 2020-06-19 (×2): 1 via ORAL
  Filled 2020-06-17 (×2): qty 1

## 2020-06-17 MED ORDER — METHYLPREDNISOLONE SODIUM SUCC 40 MG IJ SOLR
40.0000 mg | Freq: Two times a day (BID) | INTRAMUSCULAR | Status: DC
  Administered 2020-06-17 – 2020-06-19 (×4): 40 mg via INTRAVENOUS
  Filled 2020-06-17 (×5): qty 1

## 2020-06-17 MED ORDER — CALCIUM CARBONATE ANTACID 500 MG PO CHEW: 2.0000 | CHEWABLE_TABLET | ORAL | Status: DC | PRN

## 2020-06-17 NOTE — MAU Note (Signed)
Pt reports that she was feeling chills and body aches while in MAU. Pt reports when she got home she was feeling worse. Pt reports that she was burning up and was sweating.   Pt reports decreased fetal movement that started yesterday.   Denies vaginal bleeding or LOF.

## 2020-06-17 NOTE — Consult Note (Addendum)
[facility] Medical Consultation  Jeanette Becker TNQ:[id number] DOB: April 15, 1991 DOA: 06/17/2020 PCP: Patient, No Pcp Per   Requesting physician: Leroy Libman  Date of consultation: 06/17/2020   Reason for consultation: COVID-19 infection.  Impression/Recommendations Active Problems:   COVID-19 affecting pregnancy, antepartum  Symptomatic Covid infection. We will get Covid labs, Covid precautions.  Trend inflammatory biomarkers daily.  Start the patient on remdesivir IV Solu-Medrol.  Continue albuterol inhaler, antitussives.  Continue to monitor for oxygen requirement.  Currently 99% saturation on room air.  Will get chest x-ray.  Tachycardia and hypotension on presentation.  Improved. Continue antipyretics.  Patient received 1 L of Ringer lactate bolus. Hold off with further fluids if possible..   Intrauterine pregnancy at 37 weeks.  Management as per the primary team.  DVT prophylaxis.  Lovenox subcu  TRH team will followup again tomorrow. Please contact me if I can be of assistance in the meanwhile. Thank you for this consultation.  Chief Complaint: Fever, body aches  HPI:  Patient is a 29 years old female G2, P1 with intrauterine pregnancy at 37 weeks 5 days presented to the hospital with fever, body aches fatigue and generalized weakness.  Patient was seen in the office yesterday for routine visit and due to contractions she was observed in the maternity assessment unit.  Subsequently, she was discharged home but continued to have body aches, sweats, fever and chills at home.  She also complains of cough and nasal congestion and decreased fetal movement.  Denies any abdominal pain, bleeding. Denies nausea, vomiting or area.  Denies any urinary urgency frequency or dysuria.  Patient denies being vaccinated for COVID-19.  Hospitalist team was consulted for management of COVID-19 disease  Review of Systems:  All systems were reviewed and were negative except as in the  history of presenting illness  Past Medical History:  Diagnosis Date  . Medical history non-contributory    Past Surgical History:  Procedure Laterality Date  . NO PAST SURGERIES     Social History:  reports that she has never smoked. She has never used smokeless tobacco. She reports that she does not drink alcohol and does not use drugs.  No Known Allergies History reviewed. No pertinent family history.  Prior to Admission medications   Medication Sig Start Date End Date Taking? Authorizing Provider  famotidine (PEPCID) 20 MG tablet Take 1 tablet (20 mg total) by mouth 2 (two) times daily. 01/27/20  Yes Leftwich-Kirby, Wilmer Floor, CNM  Prenatal Vit-Fe Phos-FA-Omega (VITAFOL GUMMIES) 3.33-0.333-34.8 MG CHEW Chew 3 each by mouth daily. 12/16/19  Yes Burleson, Terri L, NP  Blood Pressure Monitoring (BLOOD PRESSURE KIT) DEVI 1 kit by Does not apply route as needed. 12/09/19   Constant, Peggy, MD  butalbital-acetaminophen-caffeine (FIORICET) 50-325-40 MG tablet Take 1-2 tablets by mouth every 6 (six) hours as needed for headache. Patient not taking: Reported on 03/10/2020 01/13/20 01/12/21  Hurshel Party, CNM  Elastic Bandages & Supports (COMFORT FIT MATERNITY SUPP MED) MISC 1 Device by Does not apply route daily. 06/16/20   Leftwich-Kirby, Wilmer Floor, CNM  Multiple Vitamin (MULTIVITAMIN) tablet Take 1 tablet by mouth daily. Patient not taking: Reported on 06/16/2020    [provider]  promethazine (PHENERGAN) 25 MG tablet Take 1 tablet (25 mg total) by mouth every 6 (six) hours as needed for nausea or vomiting. Patient not taking: Reported on 03/10/2020 01/07/20   Brock Bad, MD    Physical Exam: Blood pressure (!) 104/54, pulse (!) 116, temperature 99  F (37.2 C), temperature source Oral, resp. rate 12, last menstrual period 09/27/2019, SpO2 97 %. Vitals:   06/17/20 1331  BP: (!) 104/54  Pulse: (!) 116  Resp: 12  Temp: 99 F (37.2 C)  SpO2: 97%   There is no height or  weight on file to calculate BMI.  No intake or output data in the 24 hours ending 06/17/20 1721   General: Alert awake oriented not in obvious distress HENT: Normocephalic, pupils equally reacting to light and accommodation.  No scleral pallor or icterus noted. Oral mucosa is moist.  Chest:  Clear breath sounds.  Diminished breath sounds bilaterally. No crackles or wheezes.  CVS: S1 &S2 heard. No murmur.  Regular rate and rhythm. Abdomen: Soft, nontender, nondistended.  Pregnant abdomen.  Fetal heart sounds noted.  Bowel sounds are heard.   Extremities: No cyanosis, clubbing or edema.  Peripheral pulses are palpable Psych: Alert, awake and oriented, normal mood CNS:  No cranial nerve deficits.  Power equal in all extremities.  No sensory deficits noted.  No cerebellar signs.   Skin: Warm and dry.  No rashes noted.  Laboratory Data:  CBC: Recent Labs  Lab 06/17/20 1414  WBC 5.6  NEUTROABS 4.2  HGB 10.2*  HCT 31.7*  MCV 92.7  PLT 413    Basic Metabolic Panel: Recent Labs  Lab 06/17/20 1414  NA 136  K 3.9  CL 107  CO2 19*  GLUCOSE 105*  BUN <5*  CREATININE 0.44  CALCIUM 9.1    Liver Function Tests: Recent Labs  Lab 06/17/20 1414  AST 22  ALT 15  ALKPHOS 226*  BILITOT 0.6  PROT 5.7*  ALBUMIN 2.7*   No results for input(s): LIPASE, AMYLASE in the last 168 hours. No results for input(s): AMMONIA in the last 168 hours.  Cardiac Enzymes: No results for input(s): CKTOTAL, CKMB, CKMBINDEX, TROPONINI in the last 168 hours.  BNP: Invalid input(s): POCBNP  CBG: No results for input(s): GLUCAP in the last 168 hours.  Radiological Exams on Admission: No results found.  EKG: None available  Time spent: 55 mins  [name] [facility] 06/17/2020

## 2020-06-17 NOTE — MAU Provider Note (Signed)
History     CSN: [id number]  Arrival date and time: 06/17/20 1254   First Provider Initiated Contact with Patient 06/17/20 1400      Chief Complaint  Patient presents with  . Decreased Fetal Movement   HPI   [name] is a 29 y.o. female G2P1001 @ [redacted]w[redacted]d here in MAU with complaints of fever, body aches and chills. The symptoms started yesterday after she left MAU. No one in her home is sick. She has not come in contact with anyone who has covid. No cough. Reports decreased appetite. Has not received the Covid vaccine. Didn't take her temp at home because she does not have a thermometer, however reports it was very high based on her symptoms.   OB History    Gravida  2   Para  1   Term  1   Preterm      AB      Living  1     SAB      TAB      Ectopic      Multiple      Live Births  1           Past Medical History:  Diagnosis Date  . Medical history non-contributory     Past Surgical History:  Procedure Laterality Date  . NO PAST SURGERIES      History reviewed. No pertinent family history.  Social History   Tobacco Use  . Smoking status: Never Smoker  . Smokeless tobacco: Never Used  Vaping Use  . Vaping Use: Never used  Substance Use Topics  . Alcohol use: No  . Drug use: No    Allergies: No Known Allergies  Medications Prior to Admission  Medication Sig Dispense Refill Last Dose  . famotidine (PEPCID) 20 MG tablet Take 1 tablet (20 mg total) by mouth 2 (two) times daily. 60 tablet 2 06/16/2020 at Unknown time  . Prenatal Vit-Fe Phos-FA-Omega (VITAFOL GUMMIES) 3.33-0.333-34.8 MG CHEW Chew 3 each by mouth daily. 90 tablet 11 Past Week at Unknown time  . Blood Pressure Monitoring (BLOOD PRESSURE KIT) DEVI 1 kit by Does not apply route as needed. 1 each 0   . butalbital-acetaminophen-caffeine (FIORICET) 50-325-40 MG tablet Take 1-2 tablets by mouth every 6 (six) hours as needed for headache. (Patient not taking: Reported on  03/10/2020) 20 tablet 0   . Elastic Bandages & Supports (COMFORT FIT MATERNITY SUPP MED) MISC 1 Device by Does not apply route daily. 1 each 0   . Multiple Vitamin (MULTIVITAMIN) tablet Take 1 tablet by mouth daily. (Patient not taking: Reported on 06/16/2020)     . promethazine (PHENERGAN) 25 MG tablet Take 1 tablet (25 mg total) by mouth every 6 (six) hours as needed for nausea or vomiting. (Patient not taking: Reported on 03/10/2020) 30 tablet 0    Results for orders placed or performed during the hospital encounter of 06/17/20 (from the past 48 hour(s))  SARS Coronavirus 2 by RT PCR (hospital order, performed in [facility] hospital lab) Nasopharyngeal Nasopharyngeal Swab     Status: Abnormal   Collection Time: 06/17/20  2:10 PM   Specimen: Nasopharyngeal Swab  Result Value Ref Range   SARS Coronavirus 2 POSITIVE (A) NEGATIVE    Comment: RESULT CALLED TO, READ BACK BY AND VERIFIED WITH: Reggie Pile RN 15:50 06/17/20 ([email or web address]) (NOTE) SARS-CoV-2 target nucleic acids are DETECTED  SARS-CoV-2 RNA is generally detectable in upper respiratory specimens  during the acute phase of infection.  Positive results are indicative  of the presence of the identified virus, but do not rule out bacterial infection or co-infection with other pathogens not detected by the test.  Clinical correlation with patient history and  other diagnostic information is necessary to determine patient infection status.  The expected result is negative.  Fact Sheet for Patients:   StrictlyIdeas.no   Fact Sheet for Healthcare Providers:   BankingDealers.co.za    This test is not yet approved or cleared by the [place] FDA and  has been authorized for detection and/or diagnosis of SARS-CoV-2 by FDA under an Emergency Use Authorization (EUA).  This EUA will remain in effect (meaning t his test can be used) for the duration of  the COVID-19 declaration under Section  564(b)(1) of the Act, 21 U.S.C. section 360-bbb-3(b)(1), unless the authorization is terminated or revoked sooner.  Performed at [facility], [place] [address] [place], [place] [address]   CBC with Differential/Platelet     Status: Abnormal   Collection Time: 06/17/20  2:14 PM  Result Value Ref Range   WBC 5.6 4.0 - 10.5 K/uL   RBC 3.42 (L) 3.87 - 5.11 MIL/uL   Hemoglobin 10.2 (L) 12.0 - 15.0 g/dL   HCT 31.7 (L) 36 - 46 %   MCV 92.7 80.0 - 100.0 fL   MCH 29.8 26.0 - 34.0 pg   MCHC 32.2 30.0 - 36.0 g/dL   RDW 14.5 11.5 - 15.5 %   Platelets 274 150 - 400 K/uL   nRBC 0.5 (H) 0.0 - 0.2 %   Neutrophils Relative % 73 %   Neutro Abs 4.2 1.7 - 7.7 K/uL   Lymphocytes Relative 12 %   Lymphs Abs 0.7 0.7 - 4.0 K/uL   Monocytes Relative 11 %   Monocytes Absolute 0.6 0 - 1 K/uL   Eosinophils Relative 1 %   Eosinophils Absolute 0.0 0 - 0 K/uL   Basophils Relative 1 %   Basophils Absolute 0.0 0 - 0 K/uL   Immature Granulocytes 2 %   Abs Immature Granulocytes 0.09 (H) 0.00 - 0.07 K/uL    Comment: Performed at [facility], [place] [address], [place], [place] [address]  Comprehensive metabolic panel     Status: Abnormal   Collection Time: 06/17/20  2:14 PM  Result Value Ref Range   Sodium 136 135 - 145 mmol/L   Potassium 3.9 3.5 - 5.1 mmol/L   Chloride 107 98 - 111 mmol/L   CO2 19 (L) 22 - 32 mmol/L   Glucose, Bld 105 (H) 70 - 99 mg/dL    Comment: Glucose reference range applies only to samples taken after fasting for at least 8 hours.   BUN <5 (L) 6 - 20 mg/dL   Creatinine, Ser 0.44 0.44 - 1.00 mg/dL   Calcium 9.1 8.9 - 10.3 mg/dL   Total Protein 5.7 (L) 6.5 - 8.1 g/dL   Albumin 2.7 (L) 3.5 - 5.0 g/dL   AST 22 15 - 41 U/L   ALT 15 0 - 44 U/L   Alkaline Phosphatase 226 (H) 38 - 126 U/L   Total Bilirubin 0.6 0.3 - 1.2 mg/dL   GFR calc non Af Amer >60 >60 mL/min   GFR calc Af Amer >60 >60 mL/min   Anion gap 10 5 - 15    Comment: Performed at [facility], [place]  [address], [place], [place] [address]  Lactic acid, plasma     Status: None  Collection Time: 06/17/20  2:20 PM  Result Value Ref Range   Lactic Acid, Venous 1.3 0.5 - 1.9 mmol/L    Comment: Performed at [facility], [place] [address] [place],  [address]   DG CHEST PORT 1 VIEW  Result Date: 06/17/2020 CLINICAL DATA:  Chills and body aches, COVID-19 positivity EXAM: PORTABLE CHEST 1 VIEW COMPARISON:  None. FINDINGS: Cardiac shadows within normal limits. The lungs are well aerated bilaterally. Minimal patchy opacities are noted particularly in the right lung likely related to the given clinical history. No bony abnormality is noted. IMPRESSION: Patchy opacities consistent with the given clinical history of COVID-19 positivity. Electronically Signed   By: Inez Catalina M.D.   On: 06/17/2020 17:38   VAS [place] LOWER EXTREMITY VENOUS (DVT)  Result Date: 06/18/2020  Lower Venous DVTStudy Indications: Pain, Swelling, and covid +.  Performing Technologist: Abram Sander RVS  Examination Guidelines: A complete evaluation includes B-mode imaging, spectral Doppler, color Doppler, and power Doppler as needed of all accessible portions of each vessel. Bilateral testing is considered an integral part of a complete examination. Limited examinations for reoccurring indications may be performed as noted. The reflux portion of the exam is performed with the patient in reverse Trendelenburg.  +---------+---------------+---------+-----------+----------+--------------+ RIGHT    CompressibilityPhasicitySpontaneityPropertiesThrombus Aging +---------+---------------+---------+-----------+----------+--------------+ CFV      Full           Yes      Yes                                 +---------+---------------+---------+-----------+----------+--------------+ SFJ      Full                                                        +---------+---------------+---------+-----------+----------+--------------+ FV Prox   Full                                                        +---------+---------------+---------+-----------+----------+--------------+ FV Mid   Full                                                        +---------+---------------+---------+-----------+----------+--------------+ FV DistalFull                                                        +---------+---------------+---------+-----------+----------+--------------+ PFV      Full                                                        +---------+---------------+---------+-----------+----------+--------------+ POP      Full  Yes      Yes                                 +---------+---------------+---------+-----------+----------+--------------+ PTV      Full                                                        +---------+---------------+---------+-----------+----------+--------------+ PERO     Full                                                        +---------+---------------+---------+-----------+----------+--------------+   +---------+---------------+---------+-----------+----------+--------------+ LEFT     CompressibilityPhasicitySpontaneityPropertiesThrombus Aging +---------+---------------+---------+-----------+----------+--------------+ CFV      Full           Yes      Yes                                 +---------+---------------+---------+-----------+----------+--------------+ SFJ      Full                                                        +---------+---------------+---------+-----------+----------+--------------+ FV Prox  Full                                                        +---------+---------------+---------+-----------+----------+--------------+ FV Mid   Full                                                        +---------+---------------+---------+-----------+----------+--------------+ FV DistalFull                                                         +---------+---------------+---------+-----------+----------+--------------+ PFV      Full                                                        +---------+---------------+---------+-----------+----------+--------------+ POP      Full           Yes      Yes                                 +---------+---------------+---------+-----------+----------+--------------+ PTV  Full                                                        +---------+---------------+---------+-----------+----------+--------------+ PERO     Full                                                        +---------+---------------+---------+-----------+----------+--------------+     Summary: BILATERAL: - No evidence of deep vein thrombosis seen in the lower extremities, bilaterally. - No evidence of superficial venous thrombosis in the lower extremities, bilaterally. -   *See table(s) above for measurements and observations. Electronically signed by Monica Martinez MD on 06/18/2020 at 3:21:32 PM.    Final     Review of Systems  Constitutional: Positive for chills and fever.  Respiratory: Negative for shortness of breath.   Cardiovascular: Negative for chest pain.   Physical Exam   Blood pressure (!) 104/54, pulse (!) 116, temperature 99 F (37.2 C), temperature source Oral, resp. rate 12, last menstrual period 09/27/2019, SpO2 97 %.   Patient Vitals for the past 24 hrs:  BP Temp Temp src Pulse Resp SpO2  06/18/20 1455 -- -- -- -- -- 100 %  06/18/20 1454 96/60 -- -- (!) 101 -- --  06/18/20 1453 -- -- -- -- -- 99 %  06/18/20 1257 (!) 82/45 97.8 F (36.6 C) Oral 97 16 99 %  06/18/20 1255 -- -- -- -- -- 99 %  06/18/20 1250 -- -- -- -- -- 99 %  06/18/20 1245 -- -- -- -- -- 100 %  06/18/20 1240 -- -- -- -- -- 100 %  06/18/20 1235 -- -- -- -- -- 100 %  06/18/20 1230 -- -- -- -- -- 100 %  06/18/20 1225 -- -- -- -- -- 100 %  06/18/20 1220 -- -- -- -- -- 99 %  06/18/20 1218 -- -- --  -- -- 100 %  06/18/20 1210 -- -- -- -- -- 100 %  06/18/20 1205 -- -- -- -- -- 98 %  06/18/20 1200 -- -- -- -- -- 99 %  06/18/20 1020 -- -- -- -- -- 98 %  06/18/20 1015 -- -- -- -- -- 99 %  06/18/20 1010 -- -- -- -- -- 99 %  06/18/20 1005 -- -- -- -- -- 100 %  06/18/20 0955 -- -- -- -- -- 97 %  06/18/20 0952 (!) 87/45 98.3 F (36.8 C) Oral 98 18 99 %  06/18/20 0945 -- -- -- -- -- 99 %  06/18/20 0940 -- -- -- -- -- 98 %  06/18/20 0935 -- -- -- -- -- 99 %  06/18/20 0930 -- -- -- -- -- 98 %  06/18/20 0925 -- -- -- -- -- 100 %  06/18/20 0920 -- -- -- -- -- 99 %  06/18/20 0915 -- -- -- -- -- 99 %  06/18/20 0910 -- -- -- -- -- 99 %  06/18/20 0905 -- -- -- -- -- 100 %  06/18/20 0900 -- -- -- -- -- 94 %  06/18/20 0855 -- -- -- -- -- 99 %  06/18/20 0850 -- -- -- -- -- 100 %  06/18/20 0845 -- -- -- -- -- 100 %  06/18/20 0840 -- -- -- -- -- 100 %  06/18/20 0835 -- -- -- -- -- 100 %  06/18/20 0830 -- -- -- -- -- 99 %  06/18/20 0825 -- -- -- -- -- 100 %  06/18/20 0820 -- -- -- -- -- 100 %  06/18/20 0815 -- -- -- -- -- 99 %  06/18/20 0810 -- -- -- -- -- 100 %  06/18/20 0805 -- -- -- -- -- 99 %  06/18/20 0800 -- -- -- -- -- 99 %  06/18/20 0656 (!) 91/49 98.1 F (36.7 C) Oral 92 16 99 %  06/18/20 0600 -- -- -- -- -- 99 %  06/18/20 0500 -- -- -- -- -- 99 %  06/18/20 0400 -- -- -- -- -- 99 %  06/18/20 0300 -- -- -- -- -- 100 %  06/18/20 0200 -- -- -- -- -- 99 %  06/18/20 0100 -- -- -- -- -- 98 %  06/18/20 0000 -- -- -- -- -- 98 %  06/17/20 2315 (!) 102/53 98.6 F (37 C) Oral (!) 111 14 100 %  06/17/20 2300 -- -- -- -- -- 99 %  06/17/20 2256 -- -- -- (!) 135 -- --  06/17/20 2200 -- -- -- -- -- 99 %  06/17/20 2000 (!) 105/53 98.7 F (37.1 C) Oral (!) 112 15 99 %  06/17/20 1900 -- -- -- -- -- 98 %  06/17/20 1820 (!) 109/57 98.9 F (37.2 C) Oral (!) 113 15 99 %    Physical Exam Constitutional:      Appearance: She is ill-appearing and diaphoretic. She is not toxic-appearing.  HENT:      Head: Normocephalic.  Cardiovascular:     Rate and Rhythm: Normal rate and regular rhythm.     Pulses: Normal pulses.  Pulmonary:     Effort: Pulmonary effort is normal.     Breath sounds: Normal breath sounds.  Musculoskeletal:        General: Normal range of motion.     Cervical back: Normal range of motion.  Skin:    General: Skin is warm.  Neurological:     Mental Status: She is alert.  Psychiatric:        Mood and Affect: Mood normal.    MAU Course  Procedures  None  MDM Category 1 tracing, with 15x15 accels, variable decelerations with good return to baseline. Improved with position change Covid + Consulted with hospitalist who agrees to come to MAU to admit the patient.  Chest xray  LR bolus x 1 then 20 ml/hour.  Assessment and Plan   A:   1. Fever, unspecified fever cause   2. COVID-19 affecting pregnancy in third trimester   3. [redacted] weeks gestation of pregnancy     P:  Admit to [facility] speciality  Hospitalist to consult  Dr. Rosana Hoes to admit patient.   [place] Nina Mondor 06/17/2020, 3:32 PM

## 2020-06-17 NOTE — H&P (Signed)
Obstetric History and Physical  Jeanette Becker is a 29 y.o. G2P1001 with IUP at 37w5dpresenting for worsening fever, body aches. She was seen yesterday in office for routine visit and sent to MAU with contractions for labor check. Had some body aches while in MAU and was told to monitor and discharged to home. She had worsening body aches overnight and started to have sweats, fever and chills. Also with cough and some nasal congestion. Reports decreased fetal movement. Denies leaking or bleeding, reports irregular contractions.  Prenatal Course Source of Care: [facility] with onset of care at 11 weeks Pregnancy complications or risks: Patient Active Problem List   Diagnosis Date Noted  . [redacted] weeks gestation of pregnancy 06/09/2020  . Chlamydia infection affecting pregnancy in first trimester 01/27/2020  . Supervision of other normal pregnancy, antepartum 12/09/2019  . 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   Prenatal labs and studies: ABO, Rh: O/Positive/-- (02/22 1520) Antibody: Negative (02/22 1520) Rubella: 3.07 (02/22 1520) RPR: Non Reactive (06/15 0939)  HBsAg: Negative (02/22 1520)  HIV: Non Reactive (06/15 0939)  GPIR:JJOACZYS/- (08/17 0105) 2 hr GTT:  wnl Genetic screening normal [employer] [employer]  Medical History:  Past Medical History:  Diagnosis Date  . Medical history non-contributory     Past Surgical History:  Procedure Laterality Date  . NO PAST SURGERIES      OB History  Gravida Para Term Preterm AB Living  _0 SAB TAB Ectopic Multiple Live Births          1    # Outcome Date GA Lbr Len/2nd Weight Sex Delivery Anes PTL Lv  2 Current           1 Term 09/19/08     Vag-Spont   LIV    Social History   Socioeconomic History  . Marital status: Single    Spouse name: Not on file  . Number of children: Not on file  . Years of education: Not on file  . Highest  education level: Not on file  Occupational History  . Not on file  Tobacco Use  . Smoking status: Never Smoker  . Smokeless tobacco: Never Used  Vaping Use  . Vaping Use: Never used  Substance and Sexual Activity  . Alcohol use: No  . Drug use: No  . Sexual activity: Not Currently  Other Topics Concern  . Not on file  Social History Narrative  . Not on file   Social Determinants of Health   Financial Resource Strain:   . Difficulty of Paying Living Expenses: Not on file  Food Insecurity:   . Worried About RCharity fundraiserin the Last Year: Not on file  . Ran Out of Food in the Last Year: Not on file  Transportation Needs:   . Lack of Transportation (Medical): Not on file  . Lack of Transportation (Non-Medical): Not on file  Physical Activity:   . Days of Exercise per Week: Not on file  . Minutes of Exercise per Session: Not on file  Stress:   . Feeling of Stress : Not on file  Social Connections:   . Frequency of Communication with Friends and Family: Not on file  . Frequency of Social Gatherings with Friends and Family: Not on file  . Attends Religious Services: Not on file  . Active Member of Clubs or Organizations:  Not on file  . Attends Archivist Meetings: Not on file  . Marital Status: Not on file    History reviewed. No pertinent family history.  Medications Prior to Admission  Medication Sig Dispense Refill Last Dose  . famotidine (PEPCID) 20 MG tablet Take 1 tablet (20 mg total) by mouth 2 (two) times daily. 60 tablet 2 06/16/2020 at Unknown time  . Prenatal Vit-Fe Phos-FA-Omega (VITAFOL GUMMIES) 3.33-0.333-34.8 MG CHEW Chew 3 each by mouth daily. 90 tablet 11 Past Week at Unknown time  . Blood Pressure Monitoring (BLOOD PRESSURE KIT) DEVI 1 kit by Does not apply route as needed. 1 each 0   . butalbital-acetaminophen-caffeine (FIORICET) 50-325-40 MG tablet Take 1-2 tablets by mouth every 6 (six) hours as needed for headache. (Patient not taking:  Reported on 03/10/2020) 20 tablet 0   . Elastic Bandages & Supports (COMFORT FIT MATERNITY SUPP MED) MISC 1 Device by Does not apply route daily. 1 each 0   . Multiple Vitamin (MULTIVITAMIN) tablet Take 1 tablet by mouth daily. (Patient not taking: Reported on 06/16/2020)     . promethazine (PHENERGAN) 25 MG tablet Take 1 tablet (25 mg total) by mouth every 6 (six) hours as needed for nausea or vomiting. (Patient not taking: Reported on 03/10/2020) 30 tablet 0     No Known Allergies  Review of Systems: Negative except for what is mentioned in HPI.  Physical Exam: BP (!) 104/54 (BP Location: Left Arm)   Pulse (!) 116   Temp 99 F (37.2 C) (Oral)   Resp 12   LMP 09/27/2019   SpO2 97%  CONSTITUTIONAL: Well-developed, well-nourished female in mild distress, tearful.  HENT:  Normocephalic, atraumatic, External right and left ear normal. Oropharynx is clear and moist EYES: Conjunctivae and EOM are normal. Pupils are equal, round, and reactive to light. No scleral icterus.  NECK: Normal range of motion, supple, no masses SKIN: Skin is warm and dry. No rash noted. Not diaphoretic. No erythema. No pallor. NEUROLOGIC: Alert and oriented to person, place, and time. Normal reflexes, muscle tone coordination. No cranial nerve deficit noted. PSYCHIATRIC: Normal mood and affect. Normal behavior. Normal judgment and thought content. CARDIOVASCULAR: Normal heart rate noted, regular rhythm RESPIRATORY: Effort and breath sounds normal, no problems with respiration noted ABDOMEN: Soft, nondistended, gravid. MUSCULOSKELETAL: Normal range of motion. No edema and no tenderness. 2+ distal pulses.  Cervical Exam: not examined   Presentation: cephalic FHT:  Baseline rate 150 bpm   Variability moderate  Accelerations present   Decelerations variable Contractions: irregular   Pertinent Labs/Studies:   Results for orders placed or performed during the hospital encounter of 06/17/20 (from the past 24 hour(s))    SARS Coronavirus 2 by RT PCR (hospital order, performed in [facility] hospital lab) Nasopharyngeal Nasopharyngeal Swab     Status: Abnormal   Collection Time: 06/17/20  2:10 PM   Specimen: Nasopharyngeal Swab  Result Value Ref Range   SARS Coronavirus 2 POSITIVE (A) NEGATIVE  CBC with Differential/Platelet     Status: Abnormal   Collection Time: 06/17/20  2:14 PM  Result Value Ref Range   WBC 5.6 4.0 - 10.5 K/uL   RBC 3.42 (L) 3.87 - 5.11 MIL/uL   Hemoglobin 10.2 (L) 12.0 - 15.0 g/dL   HCT 31.7 (L) 36 - 46 %   MCV 92.7 80.0 - 100.0 fL   MCH 29.8 26.0 - 34.0 pg   MCHC 32.2 30.0 - 36.0 g/dL   RDW 14.5 11.5 -  15.5 %   Platelets 274 150 - 400 K/uL   nRBC 0.5 (H) 0.0 - 0.2 %   Neutrophils Relative % 73 %   Neutro Abs 4.2 1.7 - 7.7 K/uL   Lymphocytes Relative 12 %   Lymphs Abs 0.7 0.7 - 4.0 K/uL   Monocytes Relative 11 %   Monocytes Absolute 0.6 0 - 1 K/uL   Eosinophils Relative 1 %   Eosinophils Absolute 0.0 0 - 0 K/uL   Basophils Relative 1 %   Basophils Absolute 0.0 0 - 0 K/uL   Immature Granulocytes 2 %   Abs Immature Granulocytes 0.09 (H) 0.00 - 0.07 K/uL  Comprehensive metabolic panel     Status: Abnormal   Collection Time: 06/17/20  2:14 PM  Result Value Ref Range   Sodium 136 135 - 145 mmol/L   Potassium 3.9 3.5 - 5.1 mmol/L   Chloride 107 98 - 111 mmol/L   CO2 19 (L) 22 - 32 mmol/L   Glucose, Bld 105 (H) 70 - 99 mg/dL   BUN <5 (L) 6 - 20 mg/dL   Creatinine, Ser 0.44 0.44 - 1.00 mg/dL   Calcium 9.1 8.9 - 10.3 mg/dL   Total Protein 5.7 (L) 6.5 - 8.1 g/dL   Albumin 2.7 (L) 3.5 - 5.0 g/dL   AST 22 15 - 41 U/L   ALT 15 0 - 44 U/L   Alkaline Phosphatase 226 (H) 38 - 126 U/L   Total Bilirubin 0.6 0.3 - 1.2 mg/dL   GFR calc non Af Amer >60 >60 mL/min   GFR calc Af Amer >60 >60 mL/min   Anion gap 10 5 - 15  Lactic acid, plasma     Status: None   Collection Time: 06/17/20  2:20 PM  Result Value Ref Range   Lactic Acid, Venous 1.3 0.5 - 1.9 mmol/L    Assessment  : Jacquette Canales is a 29 y.o. G2P1001 at 57w5dbeing admitted for symptomatic COVID. Sat 99% on room air but tachycardic and hypotensive, appears ill. Also with decreased fetal movement and occasional variables on NST. Admit for observation overnight, pt agreeable to plan.  Plan:  Symptomatic COVID - Ad[place] to [place]- Expectant management - IVFs - hospitalist consult - Analgesia as needed  FWB  - Cat II fetal heart tracing but overall reassuring - GBS negative  Routine care - PNV   KFeliz Beam M.D. [facility] [employer](Faculty Practice)  06/17/2020, 4:56 PM

## 2020-06-18 ENCOUNTER — Inpatient Hospital Stay (HOSPITAL_COMMUNITY): Payer: Medicaid Other

## 2020-06-18 DIAGNOSIS — R7989 Other specified abnormal findings of blood chemistry: Secondary | ICD-10-CM

## 2020-06-18 DIAGNOSIS — U071 COVID-19: Secondary | ICD-10-CM

## 2020-06-18 LAB — CBC WITH DIFFERENTIAL/PLATELET
Abs Immature Granulocytes: 0.09 10*3/uL — ABNORMAL HIGH (ref 0.00–0.07)
Basophils Absolute: 0 10*3/uL (ref 0.0–0.1)
Basophils Relative: 0 %
Eosinophils Absolute: 0 10*3/uL (ref 0.0–0.5)
Eosinophils Relative: 0 %
HCT: 27.8 % — ABNORMAL LOW (ref 36.0–46.0)
Hemoglobin: 9 g/dL — ABNORMAL LOW (ref 12.0–15.0)
Immature Granulocytes: 2 %
Lymphocytes Relative: 21 %
Lymphs Abs: 1.1 10*3/uL (ref 0.7–4.0)
MCH: 29.6 pg (ref 26.0–34.0)
MCHC: 32.4 g/dL (ref 30.0–36.0)
MCV: 91.4 fL (ref 80.0–100.0)
Monocytes Absolute: 0.4 10*3/uL (ref 0.1–1.0)
Monocytes Relative: 7 %
Neutro Abs: 3.7 10*3/uL (ref 1.7–7.7)
Neutrophils Relative %: 70 %
Platelets: 240 10*3/uL (ref 150–400)
RBC: 3.04 MIL/uL — ABNORMAL LOW (ref 3.87–5.11)
RDW: 14.4 % (ref 11.5–15.5)
WBC: 5.3 10*3/uL (ref 4.0–10.5)
nRBC: 0.6 % — ABNORMAL HIGH (ref 0.0–0.2)

## 2020-06-18 LAB — COMPREHENSIVE METABOLIC PANEL
ALT: 15 U/L (ref 0–44)
AST: 20 U/L (ref 15–41)
Albumin: 2.3 g/dL — ABNORMAL LOW (ref 3.5–5.0)
Alkaline Phosphatase: 206 U/L — ABNORMAL HIGH (ref 38–126)
Anion gap: 11 (ref 5–15)
BUN: 6 mg/dL (ref 6–20)
CO2: 20 mmol/L — ABNORMAL LOW (ref 22–32)
Calcium: 8.8 mg/dL — ABNORMAL LOW (ref 8.9–10.3)
Chloride: 105 mmol/L (ref 98–111)
Creatinine, Ser: 0.42 mg/dL — ABNORMAL LOW (ref 0.44–1.00)
GFR calc Af Amer: >60 mL/min (ref >60)
GFR calc non Af Amer: >60 mL/min (ref >60)
Glucose, Bld: 123 mg/dL — ABNORMAL HIGH (ref 70–99)
Potassium: 3.6 mmol/L (ref 3.5–5.1)
Sodium: 136 mmol/L (ref 135–145)
Total Bilirubin: 0.6 mg/dL (ref 0.3–1.2)
Total Protein: 4.9 g/dL — ABNORMAL LOW (ref 6.5–8.1)

## 2020-06-18 LAB — MAGNESIUM: Magnesium: 1.6 mg/dL — ABNORMAL LOW (ref 1.7–2.4)

## 2020-06-18 LAB — D-DIMER, QUANTITATIVE: D-Dimer, Quant: 8.79 ug/mL-FEU — ABNORMAL HIGH (ref 0.00–0.50)

## 2020-06-18 LAB — FERRITIN: Ferritin: 11 ng/mL (ref 11–307)

## 2020-06-18 LAB — C-REACTIVE PROTEIN: CRP: 1.2 mg/dL — ABNORMAL HIGH (ref <1.0)

## 2020-06-18 LAB — GLUCOSE, CAPILLARY
Glucose-Capillary: 115 mg/dL — ABNORMAL HIGH (ref 70–99)
Glucose-Capillary: 150 mg/dL — ABNORMAL HIGH (ref 70–99)
Glucose-Capillary: 157 mg/dL — ABNORMAL HIGH (ref 70–99)
Glucose-Capillary: 82 mg/dL (ref 70–99)

## 2020-06-18 LAB — VITAMIN D 25 HYDROXY (VIT D DEFICIENCY, FRACTURES): Vit D, 25-Hydroxy: 14.6 ng/mL — ABNORMAL LOW (ref 30–100)

## 2020-06-18 MED ORDER — VITAMIN D (ERGOCALCIFEROL) 1.25 MG (50000 UNIT) PO CAPS
50000.0000 [IU] | ORAL_CAPSULE | ORAL | Status: DC
  Administered 2020-06-18: 50000 [IU] via ORAL
  Filled 2020-06-18: qty 1

## 2020-06-18 MED ORDER — ENOXAPARIN SODIUM 40 MG/0.4ML ~~LOC~~ SOLN
40.0000 mg | Freq: Two times a day (BID) | SUBCUTANEOUS | Status: DC
  Administered 2020-06-18 – 2020-06-19 (×3): 40 mg via SUBCUTANEOUS
  Filled 2020-06-18 (×3): qty 0.4

## 2020-06-18 NOTE — Progress Notes (Signed)
Lower extremity venous has been completed.   Preliminary results in CV Proc.   Blanch Media 06/18/2020 10:33 AM

## 2020-06-18 NOTE — Progress Notes (Signed)
FACULTY PRACTICE ANTEPARTUM PROGRESS NOTE  Jeanette Becker is a 29 y.o. G2P1001 at [redacted]w[redacted]d who is admitted for symptomatic COVID.  Estimated Date of Delivery: 07/03/20 Fetal presentation is cephalic.  Length of Stay:  1 Days. Admitted 06/17/2020  Subjective: Patient is feeling much better today. Still with some shortness of breath. Patient reports normal fetal movement, improved from yesterday.  She denies uterine contractions, denies bleeding and leaking of fluid per vagina.  Vitals:  Blood pressure (!) 82/45, pulse 97, temperature 97.8 F (36.6 C), temperature source Oral, resp. rate 16, last menstrual period 09/27/2019, SpO2 99 %. Physical Examination: CONSTITUTIONAL: Well-developed, well-nourished female in no acute distress.  HENT:  Normocephalic, atraumatic, External right and left ear normal. Oropharynx is clear and moist EYES: Conjunctivae and EOM are normal. Pupils are equal, round, and reactive to light. No scleral icterus.  NECK: Normal range of motion, supple, no masses. SKIN: Skin is warm and dry. No rash noted. Not diaphoretic. No erythema. No pallor. NEUROLGIC: Alert and oriented to person, place, and time. Normal reflexes, muscle tone coordination. No cranial nerve deficit noted. PSYCHIATRIC: Normal mood and affect. Normal behavior. Normal judgment and thought content. CARDIOVASCULAR: Normal heart rate noted RESPIRATORY: Effort normal, no problems with respiration noted MUSCULOSKELETAL: Normal range of motion. No edema and no tenderness. ABDOMEN: Soft, nontender, nondistended, gravid. CERVIX: deferred  Fetal monitoring: FHR: 140 bpm, Variability: moderate, Accelerations: Present, Decelerations: Absent  Uterine activity: no contractions per hour  Results for orders placed or performed during the hospital encounter of 06/17/20 (from the past 48 hour(s))  SARS Coronavirus 2 by RT PCR (hospital order, performed in [facility] hospital lab) Nasopharyngeal Nasopharyngeal  Swab     Status: Abnormal   Collection Time: 06/17/20  2:10 PM   Specimen: Nasopharyngeal Swab  Result Value Ref Range   SARS Coronavirus 2 POSITIVE (A) NEGATIVE    Comment: RESULT CALLED TO, READ BACK BY AND VERIFIED WITH: Rory Percy RN 15:50 06/17/20 ([email or web address]) (NOTE) SARS-CoV-2 target nucleic acids are DETECTED  SARS-CoV-2 RNA is generally detectable in upper respiratory specimens  during the acute phase of infection.  Positive results are indicative  of the presence of the identified virus, but do not rule out bacterial infection or co-infection with other pathogens not detected by the test.  Clinical correlation with patient history and  other diagnostic information is necessary to determine patient infection status.  The expected result is negative.  Fact Sheet for Patients:   BoilerBrush.com.cy   Fact Sheet for Healthcare Providers:   https://pope.com/    This test is not yet approved or cleared by the [place] FDA and  has been authorized for detection and/or diagnosis of SARS-CoV-2 by FDA under an Emergency Use Authorization (EUA).  This EUA will remain in effect (meaning t his test can be used) for the duration of  the COVID-19 declaration under Section 564(b)(1) of the Act, 21 U.S.C. section 360-bbb-3(b)(1), unless the authorization is terminated or revoked sooner.  Performed at [facility], [address] [place], [place] [address]   CBC with Differential/Platelet     Status: Abnormal   Collection Time: 06/17/20  2:14 PM  Result Value Ref Range   WBC 5.6 4.0 - 10.5 K/uL   RBC 3.42 (L) 3.87 - 5.11 MIL/uL   Hemoglobin 10.2 (L) 12.0 - 15.0 g/dL   HCT 79.8 (L) 36 - 46 %   MCV 92.7 80.0 - 100.0 fL   MCH 29.8 26.0 - 34.0 pg  MCHC 32.2 30.0 - 36.0 g/dL   RDW 53.9 76.7 - 34.1 %   Platelets 274 150 - 400 K/uL   nRBC 0.5 (H) 0.0 - 0.2 %   Neutrophils Relative % 73 %   Neutro Abs 4.2 1.7 - 7.7 K/uL    Lymphocytes Relative 12 %   Lymphs Abs 0.7 0.7 - 4.0 K/uL   Monocytes Relative 11 %   Monocytes Absolute 0.6 0 - 1 K/uL   Eosinophils Relative 1 %   Eosinophils Absolute 0.0 0 - 0 K/uL   Basophils Relative 1 %   Basophils Absolute 0.0 0 - 0 K/uL   Immature Granulocytes 2 %   Abs Immature Granulocytes 0.09 (H) 0.00 - 0.07 K/uL    Comment: Performed at [facility], [address], [place], [place] [address]  Comprehensive metabolic panel     Status: Abnormal   Collection Time: 06/17/20  2:14 PM  Result Value Ref Range   Sodium 136 135 - 145 mmol/L   Potassium 3.9 3.5 - 5.1 mmol/L   Chloride 107 98 - 111 mmol/L   CO2 19 (L) 22 - 32 mmol/L   Glucose, Bld 105 (H) 70 - 99 mg/dL    Comment: Glucose reference range applies only to samples taken after fasting for at least 8 hours.   BUN <5 (L) 6 - 20 mg/dL   Creatinine, Ser 2.40 0.44 - 1.00 mg/dL   Calcium 9.1 8.9 - 97.3 mg/dL   Total Protein 5.7 (L) 6.5 - 8.1 g/dL   Albumin 2.7 (L) 3.5 - 5.0 g/dL   AST 22 15 - 41 U/L   ALT 15 0 - 44 U/L   Alkaline Phosphatase 226 (H) 38 - 126 U/L   Total Bilirubin 0.6 0.3 - 1.2 mg/dL   GFR calc non Af Amer >60 >60 mL/min   GFR calc Af Amer >60 >60 mL/min   Anion gap 10 5 - 15    Comment: Performed at [facility], [address], [place], [place] [address]  Lactic acid, plasma     Status: None   Collection Time: 06/17/20  2:20 PM  Result Value Ref Range   Lactic Acid, Venous 1.3 0.5 - 1.9 mmol/L    Comment: Performed at [facility], [address], [place], [place] [address]  Type and screen [facility]     Status: None   Collection Time: 06/17/20  2:20 PM  Result Value Ref Range   ABO/RH(D) O POS    Antibody Screen NEG    Sample Expiration      06/20/2020,2359 Performed at [facility], [address], [place], [place] [address]   Urinalysis, Routine w reflex microscopic Urine, Clean Catch     Status: Abnormal   Collection Time: 06/17/20  5:38 PM   Result Value Ref Range   Color, Urine YELLOW YELLOW   APPearance CLEAR CLEAR   Specific Gravity, Urine 1.010 1.005 - 1.030   pH 6.0 5.0 - 8.0   Glucose, UA NEGATIVE NEGATIVE mg/dL   Hgb urine dipstick NEGATIVE NEGATIVE   Bilirubin Urine NEGATIVE NEGATIVE   Ketones, ur 20 (A) NEGATIVE mg/dL   Protein, ur NEGATIVE NEGATIVE mg/dL   Nitrite NEGATIVE NEGATIVE   Leukocytes,Ua TRACE (A) NEGATIVE   WBC, UA 0-5 0 - 5 WBC/hpf   Bacteria, UA RARE (A) NONE SEEN   Squamous Epithelial / LPF 0-5 0 - 5   Mucus PRESENT  Comment: Performed at [facility], [address] [place], [place] [address]  C-reactive protein     Status: Abnormal   Collection Time: 06/17/20  5:40 PM  Result Value Ref Range   CRP 1.1 (H) <1.0 mg/dL    Comment: Performed at [facility], [address] [place], [place] [address]  Ferritin     Status: None   Collection Time: 06/17/20  5:40 PM  Result Value Ref Range   Ferritin 11 11 - 307 ng/mL    Comment: Performed at [facility], [address] [place], [place] [address]  Fibrinogen     Status: Abnormal   Collection Time: 06/17/20  5:40 PM  Result Value Ref Range   Fibrinogen 526 (H) 210 - 475 mg/dL    Comment: Performed at [facility], [address] [place], [place] [address]  Lactate dehydrogenase     Status: None   Collection Time: 06/17/20  5:40 PM  Result Value Ref Range   LDH 159 98 - 192 U/L    Comment: Performed at [facility], [address] [place], [place] [address]  Glucose, capillary     Status: None   Collection Time: 06/17/20  7:59 PM  Result Value Ref Range   Glucose-Capillary 74 70 - 99 mg/dL    Comment: Glucose reference range applies only to samples taken after fasting for at least 8 hours.  Glucose, capillary     Status: Abnormal   Collection Time: 06/17/20  9:48 PM  Result Value Ref Range   Glucose-Capillary 148 (H) 70 - 99 mg/dL    Comment: Glucose reference range applies only to samples taken after  fasting for at least 8 hours.  VITAMIN D 25 Hydroxy (Vit-D Deficiency, Fractures)     Status: Abnormal   Collection Time: 06/17/20 10:30 PM  Result Value Ref Range   Vit D, 25-Hydroxy 14.60 (L) 30 - 100 ng/mL    Comment: (NOTE) Vitamin D deficiency has been defined by the Institute of Medicine  and an Endocrine Society practice guideline as a level of serum 25-OH  vitamin D less than 20 ng/mL (1,2). The Endocrine Society went on to  further define vitamin D insufficiency as a level between 21 and 29  ng/mL (2).  1. IOM (Institute of Medicine). 2010. Dietary reference intakes for  calcium and D. [place]: [employer]. 2. Holick MF, Binkley Panaca, Bischoff-Ferrari HA, et al. Evaluation,  treatment, and prevention of vitamin D deficiency: an Endocrine  Society clinical practice guideline, JCEM. 2011 Jul; 96(7): 1911-30.  Performed at [facility], [address] [place], [place] [address]   CBC with Differential/Platelet     Status: Abnormal   Collection Time: 06/18/20  6:26 AM  Result Value Ref Range   WBC 5.3 4.0 - 10.5 K/uL   RBC 3.04 (L) 3.87 - 5.11 MIL/uL   Hemoglobin 9.0 (L) 12.0 - 15.0 g/dL   HCT 57.827.8 (L) 36 - 46 %   MCV 91.4 80.0 - 100.0 fL   MCH 29.6 26.0 - 34.0 pg   MCHC 32.4 30.0 - 36.0 g/dL   RDW 46.914.4 62.911.5 - 52.815.5 %   Platelets 240 150 - 400 K/uL   nRBC 0.6 (H) 0.0 - 0.2 %   Neutrophils Relative % 70 %   Neutro Abs 3.7 1.7 - 7.7 K/uL   Lymphocytes Relative 21 %   Lymphs Abs 1.1 0.7 - 4.0 K/uL  Monocytes Relative 7 %   Monocytes Absolute 0.4 0 - 1 K/uL   Eosinophils Relative 0 %   Eosinophils Absolute 0.0 0 - 0 K/uL   Basophils Relative 0 %   Basophils Absolute 0.0 0 - 0 K/uL   Immature Granulocytes 2 %   Abs Immature Granulocytes 0.09 (H) 0.00 - 0.07 K/uL    Comment: Performed at [facility], [address], [place], [place] [address]  Comprehensive metabolic panel     Status: Abnormal   Collection Time: 06/18/20  6:26 AM   Result Value Ref Range   Sodium 136 135 - 145 mmol/L   Potassium 3.6 3.5 - 5.1 mmol/L   Chloride 105 98 - 111 mmol/L   CO2 20 (L) 22 - 32 mmol/L   Glucose, Bld 123 (H) 70 - 99 mg/dL    Comment: Glucose reference range applies only to samples taken after fasting for at least 8 hours.   BUN 6 6 - 20 mg/dL   Creatinine, Ser 3.61 (L) 0.44 - 1.00 mg/dL   Calcium 8.8 (L) 8.9 - 10.3 mg/dL   Total Protein 4.9 (L) 6.5 - 8.1 g/dL   Albumin 2.3 (L) 3.5 - 5.0 g/dL   AST 20 15 - 41 U/L   ALT 15 0 - 44 U/L   Alkaline Phosphatase 206 (H) 38 - 126 U/L   Total Bilirubin 0.6 0.3 - 1.2 mg/dL   GFR calc non Af Amer >60 >60 mL/min   GFR calc Af Amer >60 >60 mL/min   Anion gap 11 5 - 15    Comment: Performed at [facility], [address], [place], [place] [address]  C-reactive protein     Status: Abnormal   Collection Time: 06/18/20  6:26 AM  Result Value Ref Range   CRP 1.2 (H) <1.0 mg/dL    Comment: Performed at [facility], [address] [place], [place] [address]  D-dimer, quantitative (not at [facility])     Status: Abnormal   Collection Time: 06/18/20  6:26 AM  Result Value Ref Range   D-Dimer, Quant 8.79 (H) 0.00 - 0.50 ug/mL-FEU    Comment: (NOTE) At the manufacturer cut-off of 0.50 ug/mL FEU, this assay has been documented to exclude PE with a sensitivity and negative predictive value of 97 to 99%.  At this time, this assay has not been approved by the FDA to exclude DVT/VTE. Results should be correlated with clinical presentation. Performed at [facility], [address], [place], [place] [address]   Magnesium     Status: Abnormal   Collection Time: 06/18/20  6:26 AM  Result Value Ref Range   Magnesium 1.6 (L) 1.7 - 2.4 mg/dL    Comment: Performed at [facility], [address], [place], [place] [address]  Ferritin     Status: None   Collection Time: 06/18/20  6:26 AM  Result Value Ref Range   Ferritin 11 11 - 307 ng/mL    Comment: Performed at [facility], [address] [place], [place] [address]  Glucose, capillary     Status: None   Collection Time: 06/18/20 10:01 AM  Result Value Ref Range   Glucose-Capillary 82 70 - 99 mg/dL    Comment: Glucose reference range applies only to samples taken after fasting for at least 8 hours.    I have reviewed the patient's current medications.  ASSESSMENT: Active Problems:   COVID-19 affecting pregnancy, antepartum  PLAN:  COVID - management per hospitalist team, appreciate recommendations greatly - agree with steroids, remdesivir, lovenox  Routine pregnancy care - delivery as indicated for maternal/fetal concerns, reviewed this with patient today, answered all questions - NST BID - PNV   Continue routine antenatal care.   Baldemar Lenis, M.D. [facility]ending [facility] [employer] (Faculty Practice)  06/18/2020 1:44 PM

## 2020-06-18 NOTE — Progress Notes (Signed)
PROGRESS NOTE                                                                                                                                                                                                             Patient Demographics:    Jeanette Becker, is a 29 y.o. female, DOB - 06/26/1991, WUJ:[id number]  Outpatient Primary MD for the patient is Patient, No Pcp Per   Admit date - 06/17/2020   LOS - 1  Chief Complaint  Patient presents with   Decreased Fetal Movement       Brief Narrative: Patient is a 29 y.o. female [redacted] weeks pregnant-admitted for symptomatic COVID-19 infection.  Hospitalist service consulted for management of COVID-19 pneumonia.  COVID-19 vaccinated status: Unvaccinated  Significant Events: 8/25>> [facility]  Significant studies: 8/25>>Chest x-ray: Patchy opacities consistent with COVID-19. 8/26>> bilateral lower extremity Doppler: Negative for DVT (prelim)  COVID-19 medications: Steroids: 8/25>> Remdesivir: 8/25>>  Antibiotics: None  Microbiology data: Blood culture:None  Procedures: None   DVT prophylaxis: enoxaparin (LOVENOX) injection 40 mg Start: 06/17/20 2200 SCDs Start: 06/17/20 1654     Subjective:    Jeanette Becker today is lying comfortably in bed-at rest she has no symptoms.  Her main complaints are some cough, generalized myalgias and weakness.   Assessment  & Plan :    Covid 19 Viral pneumonia: Appears to have mild disease-plans are to continue with steroids and Remdesivir.  Watch for development of hypoxemia.  Fever: afebrile  O2 requirements:  SpO2: 99 %   COVID-19 Labs: Recent Labs    06/17/20 1740 06/18/20 0626  DDIMER  --  8.79*  FERRITIN 11 11  LDH 159  --   CRP 1.1* 1.2*    No results found for: BNP  No results for input(s): PROCALCITON in the last 168 hours.  Lab Results  Component Value Date     SARSCOV2NAA POSITIVE (A) 06/17/2020     Prone/Incentive Spirometry: encouraged  incentive spirometry use 3-4/hour.  Elevated D-dimer: Lower extremity Doppler negative-she has no hypoxemia-for now we will change to intermediate dosing of Lovenox and follow closely.  If hypoxemia were to develop or D-dimer continue to increase-we can consider CTA chest at some point.  [redacted] weeks pregnant: Deferred to the primary service  ABG: No results found for: PHART,  PCO2ART, PO2ART, HCO3, TCO2, ACIDBASEDEF, O2SAT  Vent Settings: N/A   Condition - Stable  Family Communication  : Significant other sleeping at bedside.  Code Status :  Full Code  Diet :  Diet Order            Diet regular Room service appropriate? Yes; Fluid consistency: Thin  Diet effective now                  Disposition Plan  : Per primary service   Barriers to discharge: Symptomatic COVID-19 infection-needs to complete 5 days of remdesivir.   Antimicorbials  :    Anti-infectives (From admission, onward)   Start     Dose/Rate Route Frequency Ordered Stop   06/18/20 1000  remdesivir 100 mg in sodium chloride 0.9 % 100 mL IVPB       "Followed by" Linked Group Details   100 mg 200 mL/hr over 30 Minutes Intravenous Daily 06/17/20 1720 06/22/20 0959   06/17/20 1900  remdesivir 200 mg in sodium chloride 0.9% 250 mL IVPB       "Followed by" Linked Group Details   200 mg 580 mL/hr over 30 Minutes Intravenous Once 06/17/20 1720 06/17/20 2048      Inpatient Medications  Scheduled Meds:  albuterol  2 puff Inhalation Q6H   vitamin C  500 mg Oral Daily   enoxaparin (LOVENOX) injection  40 mg Subcutaneous Q24H   insulin aspart  0-9 Units Subcutaneous TID WC   methylPREDNISolone (SOLU-MEDROL) injection  40 mg Intravenous Q12H   prenatal multivitamin  1 tablet Oral Q1200   zinc sulfate  220 mg Oral Daily   Continuous Infusions:  remdesivir 100 mg in NS 100 mL 100 mg (06/18/20 1015)   PRN  Meds:.acetaminophen, calcium carbonate, chlorpheniramine-HYDROcodone, docusate sodium, guaiFENesin-dextromethorphan, zolpidem   Time Spent in minutes  25  See all Orders from today for further details   Jeoffrey Massed M.D on 06/18/2020 at 11:27 AM  To page go to [email or web address] - use universal password  [facility] -  Office  [phone]    Objective:   Vitals:   06/18/20 0500 06/18/20 0600 06/18/20 0656 06/18/20 0952  BP:   (!) 91/49 (!) 87/45  Pulse:   92 98  Resp:   16 18  Temp:   98.1 F (36.7 C) 98.3 F (36.8 C)  TempSrc:   Oral Oral  SpO2: 99% 99% 99% 99%    Wt Readings from Last 3 Encounters:  06/16/20 70.8 kg  06/09/20 72 kg  05/19/20 68.9 kg    No intake or output data in the 24 hours ending 06/18/20 1127   Physical Exam Gen Exam:Alert awake-not in any distress HEENT:atraumatic, normocephalic Chest: B/L clear to auscultation anteriorly CVS:S1S2 regular Abdomen:soft non tender, non distended Extremities:no edema Neurology: Non focal Skin: no rash   Data Review:    CBC Recent Labs  Lab 06/17/20 1414 06/18/20 0626  WBC 5.6 5.3  HGB 10.2* 9.0*  HCT 31.7* 27.8*  PLT 274 240  MCV 92.7 91.4  MCH 29.8 29.6  MCHC 32.2 32.4  RDW 14.5 14.4  LYMPHSABS 0.7 1.1  MONOABS 0.6 0.4  EOSABS 0.0 0.0  BASOSABS 0.0 0.0    Chemistries  Recent Labs  Lab 06/17/20 1414 06/18/20 0626  NA 136 136  K 3.9 3.6  CL 107 105  CO2 19* 20*  GLUCOSE 105* 123*  BUN <5* 6  CREATININE 0.44 0.42*  CALCIUM 9.1 8.8*  MG  --  1.6*  AST 22 20  ALT 15 15  ALKPHOS 226* 206*  BILITOT 0.6 0.6   ------------------------------------------------------------------------------------------------------------------ No results for input(s): CHOL, HDL, LDLCALC, TRIG, CHOLHDL, LDLDIRECT in the last 72 hours.  No results found for: HGBA1C ------------------------------------------------------------------------------------------------------------------ No results for  input(s): TSH, T4TOTAL, T3FREE, THYROIDAB in the last 72 hours.  Invalid input(s): FREET3 ------------------------------------------------------------------------------------------------------------------ Recent Labs    06/17/20 1740 06/18/20 0626  FERRITIN 11 11    Coagulation profile No results for input(s): INR, PROTIME in the last 168 hours.  Recent Labs    06/18/20 0626  DDIMER 8.79*    Cardiac Enzymes No results for input(s): CKMB, TROPONINI, MYOGLOBIN in the last 168 hours.  Invalid input(s): CK ------------------------------------------------------------------------------------------------------------------ No results found for: BNP  Micro Results Recent Results (from the past 240 hour(s))  Culture, beta strep (group b only)     Status: None   Collection Time: 06/09/20  1:05 AM   Specimen: Vaginal/Rectal; Genital   VR  Result Value Ref Range Status   Strep Gp B Culture Negative Negative Final    Comment: Centers for Disease Control and Prevention (CDC) and American Congress of Obstetricians and Gynecologists (ACOG) guidelines for prevention of perinatal group B streptococcal (GBS) disease specify co-collection of a vaginal and rectal swab specimen to maximize sensitivity of GBS detection. Per the CDC and ACOG, swabbing both the lower vagina and rectum substantially increases the yield of detection compared with sampling the vagina alone. Penicillin G, ampicillin, or cefazolin are indicated for intrapartum prophylaxis of perinatal GBS colonization. Reflex susceptibility testing should be performed prior to use of clindamycin only on GBS isolates from penicillin-allergic women who are considered a high risk for anaphylaxis. Treatment with vancomycin without additional testing is warranted if resistance to clindamycin is noted.   SARS Coronavirus 2 by RT PCR (hospital order, performed in [facility] hospital lab) Nasopharyngeal Nasopharyngeal Swab     Status:  Abnormal   Collection Time: 06/17/20  2:10 PM   Specimen: Nasopharyngeal Swab  Result Value Ref Range Status   SARS Coronavirus 2 POSITIVE (A) NEGATIVE Final    Comment: RESULT CALLED TO, READ BACK BY AND VERIFIED WITH: N. Druebbisch RN 15:50 06/17/20 ([email or web address]) (NOTE) SARS-CoV-2 target nucleic acids are DETECTED  SARS-CoV-2 RNA is generally detectable in upper respiratory specimens  during the acute phase of infection.  Positive results are indicative  of the presence of the identified virus, but do not rule out bacterial infection or co-infection with other pathogens not detected by the test.  Clinical correlation with patient history and  other diagnostic information is necessary to determine patient infection status.  The expected result is negative.  Fact Sheet for Patients:   BoilerBrush.com.cy   Fact Sheet for Healthcare Providers:   https://pope.com/    This test is not yet approved or cleared by the [place] FDA and  has been authorized for detection and/or diagnosis of SARS-CoV-2 by FDA under an Emergency Use Authorization (EUA).  This EUA will remain in effect (meaning t his test can be used) for the duration of  the COVID-19 declaration under Section 564(b)(1) of the Act, 21 U.S.C. section 360-bbb-3(b)(1), unless the authorization is terminated or revoked sooner.  Performed at [facility], [address], [place], [place] [address]     Radiology Reports DG CHEST PORT 1 VIEW  Result Date: 06/17/2020 CLINICAL DATA:  Chills and body aches, COVID-19 positivity EXAM: PORTABLE CHEST 1 VIEW COMPARISON:  None. FINDINGS: Cardiac shadows within normal limits. The lungs  are well aerated bilaterally. Minimal patchy opacities are noted particularly in the right lung likely related to the given clinical history. No bony abnormality is noted. IMPRESSION: Patchy opacities consistent with the given clinical history of  COVID-19 positivity. Electronically Signed   By: Alcide Clever M.D.   On: 06/17/2020 17:38   VAS [place] LOWER EXTREMITY VENOUS (DVT)  Result Date: 06/18/2020  Lower Venous DVTStudy Indications: Pain, Swelling, and covid +.  Performing Technologist: Blanch Media RVS  Examination Guidelines: A complete evaluation includes B-mode imaging, spectral Doppler, color Doppler, and power Doppler as needed of all accessible portions of each vessel. Bilateral testing is considered an integral part of a complete examination. Limited examinations for reoccurring indications may be performed as noted. The reflux portion of the exam is performed with the patient in reverse Trendelenburg.  +---------+---------------+---------+-----------+----------+--------------+  RIGHT     Compressibility Phasicity Spontaneity Properties Thrombus Aging  +---------+---------------+---------+-----------+----------+--------------+  CFV       Full            Yes       Yes                                    +---------+---------------+---------+-----------+----------+--------------+  SFJ       Full                                                             +---------+---------------+---------+-----------+----------+--------------+  FV Prox   Full                                                             +---------+---------------+---------+-----------+----------+--------------+  FV Mid    Full                                                             +---------+---------------+---------+-----------+----------+--------------+  FV Distal Full                                                             +---------+---------------+---------+-----------+----------+--------------+  PFV       Full                                                             +---------+---------------+---------+-----------+----------+--------------+  POP       Full            Yes       Yes                                     +---------+---------------+---------+-----------+----------+--------------+  PTV       Full                                                             +---------+---------------+---------+-----------+----------+--------------+  PERO      Full                                                             +---------+---------------+---------+-----------+----------+--------------+   +---------+---------------+---------+-----------+----------+--------------+  LEFT      Compressibility Phasicity Spontaneity Properties Thrombus Aging  +---------+---------------+---------+-----------+----------+--------------+  CFV       Full            Yes       Yes                                    +---------+---------------+---------+-----------+----------+--------------+  SFJ       Full                                                             +---------+---------------+---------+-----------+----------+--------------+  FV Prox   Full                                                             +---------+---------------+---------+-----------+----------+--------------+  FV Mid    Full                                                             +---------+---------------+---------+-----------+----------+--------------+  FV Distal Full                                                             +---------+---------------+---------+-----------+----------+--------------+  PFV       Full                                                             +---------+---------------+---------+-----------+----------+--------------+  POP       Full            Yes       Yes                                    +---------+---------------+---------+-----------+----------+--------------+  PTV       Full                                                             +---------+---------------+---------+-----------+----------+--------------+  PERO      Full                                                              +---------+---------------+---------+-----------+----------+--------------+     Summary: BILATERAL: - No evidence of deep vein thrombosis seen in the lower extremities, bilaterally. - No evidence of superficial venous thrombosis in the lower extremities, bilaterally. -   *See table(s) above for measurements and observations.    Preliminary

## 2020-06-19 DIAGNOSIS — Z3A38 38 weeks gestation of pregnancy: Secondary | ICD-10-CM

## 2020-06-19 LAB — CBC WITH DIFFERENTIAL/PLATELET
Abs Immature Granulocytes: 0.1 10*3/uL — ABNORMAL HIGH (ref 0.00–0.07)
Basophils Absolute: 0 10*3/uL (ref 0.0–0.1)
Basophils Relative: 0 %
Eosinophils Absolute: 0 10*3/uL (ref 0.0–0.5)
Eosinophils Relative: 0 %
HCT: 29.7 % — ABNORMAL LOW (ref 36.0–46.0)
Hemoglobin: 9.8 g/dL — ABNORMAL LOW (ref 12.0–15.0)
Immature Granulocytes: 2 %
Lymphocytes Relative: 23 %
Lymphs Abs: 1.1 10*3/uL (ref 0.7–4.0)
MCH: 30.6 pg (ref 26.0–34.0)
MCHC: 33 g/dL (ref 30.0–36.0)
MCV: 92.8 fL (ref 80.0–100.0)
Monocytes Absolute: 0.5 10*3/uL (ref 0.1–1.0)
Monocytes Relative: 9 %
Neutro Abs: 3.3 10*3/uL (ref 1.7–7.7)
Neutrophils Relative %: 66 %
Platelets: 253 10*3/uL (ref 150–400)
RBC: 3.2 MIL/uL — ABNORMAL LOW (ref 3.87–5.11)
RDW: 14.6 % (ref 11.5–15.5)
WBC: 5 10*3/uL (ref 4.0–10.5)
nRBC: 0.8 % — ABNORMAL HIGH (ref 0.0–0.2)

## 2020-06-19 LAB — COMPREHENSIVE METABOLIC PANEL
ALT: 16 U/L (ref 0–44)
AST: 21 U/L (ref 15–41)
Albumin: 2.4 g/dL — ABNORMAL LOW (ref 3.5–5.0)
Alkaline Phosphatase: 208 U/L — ABNORMAL HIGH (ref 38–126)
Anion gap: 10 (ref 5–15)
BUN: 8 mg/dL (ref 6–20)
CO2: 21 mmol/L — ABNORMAL LOW (ref 22–32)
Calcium: 8.7 mg/dL — ABNORMAL LOW (ref 8.9–10.3)
Chloride: 106 mmol/L (ref 98–111)
Creatinine, Ser: 0.55 mg/dL (ref 0.44–1.00)
GFR calc Af Amer: >60 mL/min (ref >60)
GFR calc non Af Amer: >60 mL/min (ref >60)
Glucose, Bld: 154 mg/dL — ABNORMAL HIGH (ref 70–99)
Potassium: 3.7 mmol/L (ref 3.5–5.1)
Sodium: 137 mmol/L (ref 135–145)
Total Bilirubin: 0.5 mg/dL (ref 0.3–1.2)
Total Protein: 5.1 g/dL — ABNORMAL LOW (ref 6.5–8.1)

## 2020-06-19 LAB — GLUCOSE, CAPILLARY: Glucose-Capillary: 108 mg/dL — ABNORMAL HIGH (ref 70–99)

## 2020-06-19 LAB — MAGNESIUM: Magnesium: 1.6 mg/dL — ABNORMAL LOW (ref 1.7–2.4)

## 2020-06-19 LAB — D-DIMER, QUANTITATIVE: D-Dimer, Quant: 6.29 ug/mL-FEU — ABNORMAL HIGH (ref 0.00–0.50)

## 2020-06-19 LAB — C-REACTIVE PROTEIN: CRP: 1 mg/dL — ABNORMAL HIGH (ref <1.0)

## 2020-06-19 LAB — FERRITIN: Ferritin: 14 ng/mL (ref 11–307)

## 2020-06-19 MED ORDER — ENOXAPARIN (LOVENOX) PATIENT EDUCATION KIT
PACK | Freq: Once | Status: AC
  Filled 2020-06-19: qty 1

## 2020-06-19 MED ORDER — PREDNISONE 10 MG PO TABS: 10.0000 mg | ORAL_TABLET | Freq: Every day | ORAL | 0 refills | Status: AC

## 2020-06-19 MED ORDER — ENOXAPARIN SODIUM 40 MG/0.4ML ~~LOC~~ SOLN: 40.0000 mg | SUBCUTANEOUS | 0 refills | Status: DC

## 2020-06-19 NOTE — Progress Notes (Signed)
PROGRESS NOTE                                                                                                                                                                                                             Patient Demographics:    Jeanette Becker, is a 29 y.o. female, DOB - July 18, 1991, YOV:[id number]  Outpatient Primary MD for the patient is Patient, No Pcp Per   Admit date - 06/17/2020   LOS - 2  Chief Complaint  Patient presents with  . Decreased Fetal Movement       Brief Narrative: Patient is a 29 y.o. female [redacted] weeks pregnant-admitted for symptomatic COVID-19 infection.  Hospitalist service consulted for management of COVID-19 pneumonia.  COVID-19 vaccinated status: Unvaccinated  Significant Events: 8/25>> Admit to [facility] for COVID-19 pneumonia  Significant studies: 8/25>>Chest x-ray: Patchy opacities consistent with COVID-19. 8/26>> bilateral lower extremity Doppler: Negative for DVT   COVID-19 medications: Steroids: 8/25>> Remdesivir: 8/25>>  Antibiotics: None  Microbiology data: Blood culture:None  Procedures: None   DVT prophylaxis: enoxaparin (LOVENOX) injection 40 mg Start: 06/18/20 1145 SCDs Start: 06/17/20 1654     Subjective:   Feels much better today-hardly any cough.  She apparently ambulated in her room multiple times yesterday-and does not have any shortness of breath.  Inquiring about whether she could go home today.  Her boyfriend was at bedside.   Assessment  & Plan :    Covid 19 Viral pneumonia: No hypoxia-symptomatically much better-ambulating in the room without any issues today.  O2 saturation 100% on room air.  Would be a good candidate to continue Remdesivir in the outpatient setting-primary service has already called Remdesivir clinic and set her up for Saturday and Sunday.  Since she is not hypoxic-and symptomatically improved-stable for discharge-suspect  okay to continue with prednisone taper.  Discussed with primary MD-Dr. Shawnie Pons today.  Fever: afebrile  O2 requirements:  SpO2: 100 %   COVID-19 Labs: Recent Labs    06/17/20 1740 06/18/20 0626 06/19/20 0608  DDIMER  --  8.79* 6.29*  FERRITIN 11 11 14   LDH 159  --   --   CRP 1.1* 1.2* 1.0*    No results found for: BNP  No results for input(s): PROCALCITON in the last 168 hours.  Lab Results  Component Value Date   SARSCOV2NAA POSITIVE (A)  06/17/2020     Prone/Incentive Spirometry: encouraged  incentive spirometry use 3-4/hour.  Elevated D-dimer: D-dimer downtrending-likely secondary to COVID-19-lower extremity Doppler negative for DVT.  She is not hypoxic-and symptomatically improved in terms of her Covid-therefore do not think she has PE.  Spoke with Dr. Lexine BatonPratt-recommendations are to continue with prophylactic Lovenox for another 10 days or so-given that she is at risk for VTE with pregnancy and COVID-19 infection  [redacted] weeks pregnant: Deferred to the primary service  Will continue to see her if she is still here tomorrow-but suspect she is stable to to be discharged to continue with tapering prednisone-and remdesivir in the infusion center.    ABG: No results found for: PHART, PCO2ART, PO2ART, HCO3, TCO2, ACIDBASEDEF, O2SAT  Vent Settings: N/A   Condition - Stable  Family Communication  : Significant other at bedside.  Code Status :  Full Code  Diet :  Diet Order            Diet regular Room service appropriate? Yes; Fluid consistency: Thin  Diet effective now                  Disposition Plan  : Per primary service   Barriers to discharge: Symptomatic COVID-19 infection-needs to complete 5 days of remdesivir.   Antimicorbials  :    Anti-infectives (From admission, onward)   Start     Dose/Rate Route Frequency Ordered Stop   06/18/20 1000  remdesivir 100 mg in sodium chloride 0.9 % 100 mL IVPB       "Followed by" Linked Group Details   100 mg 200  mL/hr over 30 Minutes Intravenous Daily 06/17/20 1720 06/22/20 0959   06/17/20 1900  remdesivir 200 mg in sodium chloride 0.9% 250 mL IVPB       "Followed by" Linked Group Details   200 mg 580 mL/hr over 30 Minutes Intravenous Once 06/17/20 1720 06/17/20 2048      Inpatient Medications  Scheduled Meds: . albuterol  2 puff Inhalation Q6H  . vitamin C  500 mg Oral Daily  . enoxaparin (LOVENOX) injection  40 mg Subcutaneous Q12H  . insulin aspart  0-9 Units Subcutaneous TID WC  . methylPREDNISolone (SOLU-MEDROL) injection  40 mg Intravenous Q12H  . prenatal multivitamin  1 tablet Oral Q1200  . Vitamin D (Ergocalciferol)  50,000 Units Oral Q7 days  . zinc sulfate  220 mg Oral Daily   Continuous Infusions: . remdesivir 100 mg in NS 100 mL 100 mg (06/19/20 1014)   PRN Meds:.acetaminophen, calcium carbonate, chlorpheniramine-HYDROcodone, docusate sodium, guaiFENesin-dextromethorphan, zolpidem   Time Spent in minutes  25  See all Orders from today for further details   Jeoffrey MassedShanker Tevis Conger M.D on 06/19/2020 at 11:55 AM  To page go to [email or web address] - use universal password  [facility] -  Office  [phone]    Objective:   Vitals:   06/18/20 2045 06/19/20 0025 06/19/20 0625 06/19/20 1003  BP: (!) 100/57 (!) 88/46 (!) 93/50 (!) 98/52  Pulse: (!) 105 86 85 79  Resp: 17 16 16 17   Temp: 99 F (37.2 C) 98.6 F (37 C) 97.9 F (36.6 C) (!) 97.5 F (36.4 C)  TempSrc: Oral Oral Oral Oral  SpO2: 99% 100% 100% 100%    Wt Readings from Last 3 Encounters:  06/16/20 70.8 kg  06/09/20 72 kg  05/19/20 68.9 kg     Intake/Output Summary (Last 24 hours) at 06/19/2020 1155 Last data filed at 06/18/2020 1700 Gross per 24 hour  Intake 600 ml  Output --  Net 600 ml     Physical Exam Gen Exam:Alert awake-not in any distress HEENT:atraumatic, normocephalic Chest: B/L clear to auscultation anteriorly CVS:S1S2 regular Abdomen:soft non tender, non distended Extremities:no  edema Neurology: Non focal Skin: no rash   Data Review:    CBC Recent Labs  Lab 06/17/20 1414 06/18/20 0626 06/19/20 0608  WBC 5.6 5.3 5.0  HGB 10.2* 9.0* 9.8*  HCT 31.7* 27.8* 29.7*  PLT 274 240 253  MCV 92.7 91.4 92.8  MCH 29.8 29.6 30.6  MCHC 32.2 32.4 33.0  RDW 14.5 14.4 14.6  LYMPHSABS 0.7 1.1 1.1  MONOABS 0.6 0.4 0.5  EOSABS 0.0 0.0 0.0  BASOSABS 0.0 0.0 0.0    Chemistries  Recent Labs  Lab 06/17/20 1414 06/18/20 0626 06/19/20 0608  NA 136 136 137  K 3.9 3.6 3.7  CL 107 105 106  CO2 19* 20* 21*  GLUCOSE 105* 123* 154*  BUN <5* 6 8  CREATININE 0.44 0.42* 0.55  CALCIUM 9.1 8.8* 8.7*  MG  --  1.6* 1.6*  AST 22 20 21   ALT 15 15 16   ALKPHOS 226* 206* 208*  BILITOT 0.6 0.6 0.5   ------------------------------------------------------------------------------------------------------------------ No results for input(s): CHOL, HDL, LDLCALC, TRIG, CHOLHDL, LDLDIRECT in the last 72 hours.  No results found for: HGBA1C ------------------------------------------------------------------------------------------------------------------ No results for input(s): TSH, T4TOTAL, T3FREE, THYROIDAB in the last 72 hours.  Invalid input(s): FREET3 ------------------------------------------------------------------------------------------------------------------ Recent Labs    06/18/20 0626 06/19/20 0608  FERRITIN 11 14    Coagulation profile No results for input(s): INR, PROTIME in the last 168 hours.  Recent Labs    06/18/20 0626 06/19/20 0608  DDIMER 8.79* 6.29*    Cardiac Enzymes No results for input(s): CKMB, TROPONINI, MYOGLOBIN in the last 168 hours.  Invalid input(s): CK ------------------------------------------------------------------------------------------------------------------ No results found for: BNP  Micro Results Recent Results (from the past 240 hour(s))  SARS Coronavirus 2 by RT PCR (hospital order, performed in [facility] hospital lab)  Nasopharyngeal Nasopharyngeal Swab     Status: Abnormal   Collection Time: 06/17/20  2:10 PM   Specimen: Nasopharyngeal Swab  Result Value Ref Range Status   SARS Coronavirus 2 POSITIVE (A) NEGATIVE Final    Comment: RESULT CALLED TO, READ BACK BY AND VERIFIED WITH: [facility] RN 15:50 06/17/20 ([email or web address]) (NOTE) SARS-CoV-2 target nucleic acids are DETECTED  SARS-CoV-2 RNA is generally detectable in upper respiratory specimens  during the acute phase of infection.  Positive results are indicative  of the presence of the identified virus, but do not rule out bacterial infection or co-infection with other pathogens not detected by the test.  Clinical correlation with patient history and  other diagnostic information is necessary to determine patient infection status.  The expected result is negative.  Fact Sheet for Patients:   Rory Percy   Fact Sheet for Healthcare Providers:   06/19/20    This test is not yet approved or cleared by the [employer] FDA and  has been authorized for detection and/or diagnosis of SARS-CoV-2 by FDA under an Emergency Use Authorization (EUA).  This EUA will remain in effect (meaning t his test can be used) for the duration of  the COVID-19 declaration under Section 564(b)(1) of the Act, 21 U.S.C. section 360-bbb-3(b)(1), unless the authorization is terminated or revoked sooner.  Performed at [facility], [address], [place], [address] [place]     Radiology Reports DG CHEST PORT 1 VIEW  Result Date:  06/17/2020 CLINICAL DATA:  Chills and body aches, COVID-19 positivity EXAM: PORTABLE CHEST 1 VIEW COMPARISON:  None. FINDINGS: Cardiac shadows within normal limits. The lungs are well aerated bilaterally. Minimal patchy opacities are noted particularly in the right lung likely related to the given clinical history. No bony abnormality is noted. IMPRESSION: Patchy opacities  consistent with the given clinical history of COVID-19 positivity. Electronically Signed   By: Alcide Clever M.D.   On: 06/17/2020 17:38   VAS [place] LOWER EXTREMITY VENOUS (DVT)  Result Date: 06/18/2020  Lower Venous DVTStudy Indications: Pain, Swelling, and covid +.  Performing Technologist: Blanch Media RVS  Examination Guidelines: A complete evaluation includes B-mode imaging, spectral Doppler, color Doppler, and power Doppler as needed of all accessible portions of each vessel. Bilateral testing is considered an integral part of a complete examination. Limited examinations for reoccurring indications may be performed as noted. The reflux portion of the exam is performed with the patient in reverse Trendelenburg.  +---------+---------------+---------+-----------+----------+--------------+ RIGHT    CompressibilityPhasicitySpontaneityPropertiesThrombus Aging +---------+---------------+---------+-----------+----------+--------------+ CFV      Full           Yes      Yes                                 +---------+---------------+---------+-----------+----------+--------------+ SFJ      Full                                                        +---------+---------------+---------+-----------+----------+--------------+ FV Prox  Full                                                        +---------+---------------+---------+-----------+----------+--------------+ FV Mid   Full                                                        +---------+---------------+---------+-----------+----------+--------------+ FV DistalFull                                                        +---------+---------------+---------+-----------+----------+--------------+ PFV      Full                                                        +---------+---------------+---------+-----------+----------+--------------+ POP      Full           Yes      Yes                                  +---------+---------------+---------+-----------+----------+--------------+ PTV  Full                                                        +---------+---------------+---------+-----------+----------+--------------+ PERO     Full                                                        +---------+---------------+---------+-----------+----------+--------------+   +---------+---------------+---------+-----------+----------+--------------+ LEFT     CompressibilityPhasicitySpontaneityPropertiesThrombus Aging +---------+---------------+---------+-----------+----------+--------------+ CFV      Full           Yes      Yes                                 +---------+---------------+---------+-----------+----------+--------------+ SFJ      Full                                                        +---------+---------------+---------+-----------+----------+--------------+ FV Prox  Full                                                        +---------+---------------+---------+-----------+----------+--------------+ FV Mid   Full                                                        +---------+---------------+---------+-----------+----------+--------------+ FV DistalFull                                                        +---------+---------------+---------+-----------+----------+--------------+ PFV      Full                                                        +---------+---------------+---------+-----------+----------+--------------+ POP      Full           Yes      Yes                                 +---------+---------------+---------+-----------+----------+--------------+ PTV      Full                                                        +---------+---------------+---------+-----------+----------+--------------+  PERO     Full                                                         +---------+---------------+---------+-----------+----------+--------------+     Summary: BILATERAL: - No evidence of deep vein thrombosis seen in the lower extremities, bilaterally. - No evidence of superficial venous thrombosis in the lower extremities, bilaterally. -   *See table(s) above for measurements and observations. Electronically signed by Sherald Hess MD on 06/18/2020 at 3:21:32 PM.    Final

## 2020-06-19 NOTE — Progress Notes (Signed)
Patient scheduled for outpatient Remdesivir infusions at 10am on Saturday 8/28 and Sunday 8/29 at [facility]. Please inform the patient to park at [address] [place], [place], as staff will be escorting the patient through the east entrance of the hospital.    There is a wave flag banner located near the entrance on [address]. Turn into this entrance and immediately turn left and park in 1 of the 5 designated Covid Infusion Parking spots. There is a phone number on the sign, please call and let the staff know what spot you are in and we will come out and get you. For questions call [phone].  Thanks.

## 2020-06-19 NOTE — Discharge Summary (Signed)
Antenatal Physician Discharge Summary  Patient ID: Jeanette Becker MRN: [id number] DOB/AGE: [birth year] 29 y.o.  Admit date: 06/17/2020 Discharge date: 06/19/2020  Admission Diagnoses: Active Problems:   COVID-19 affecting pregnancy, antepartum    Discharge Diagnoses: Same  Prenatal Procedures: Remdesivir, Solu-Medrol  Consults: [facility] hospitalist  Hospital Course:  Jeanette Becker is a 29 y.o. G2P1001 with IUP at [redacted]w[redacted]d admitted for COVID-19.  She was admitted with shortness of breath and abnormal chest x-ray.  She was started on remdesivir and Solu-Medrol.  She continued to improve.  Her inflammatory markers were followed, her D-dimer was elevated, she was placed on Lovenox.  This is continued until 7 days postpartum.  She was able to maintain her oxygen saturations above 97% even with ambulation.  She was deemed stable for discharge.  She will continue a prednisone taper over the next 7 days.  Her last 2 doses of remdesivir will be completed at [facility] outpatient facility.  Discharge Exam: Temp:  [97.5 F (36.4 C)-99 F (37.2 C)] 97.5 F (36.4 C) (08/27 1003) Pulse Rate:  [79-105] 79 (08/27 1003) Resp:  [16-17] 17 (08/27 1003) BP: (82-100)/(45-60) 98/52 (08/27 1003) SpO2:  [98 %-100 %] 100 % (08/27 1003) Physical Examination: CONSTITUTIONAL: Well-developed, well-nourished female in no acute distress.  HENT:  Normocephalic, atraumatic, External right and left ear normal. Oropharynx is clear and moist EYES: Conjunctivae and EOM are normal. Pupils are equal, round, and reactive to light. No scleral icterus.  NECK: Normal range of motion, supple, no masses SKIN: Skin is warm and dry. No rash noted. Not diaphoretic. No erythema. No pallor. [place]: Alert and oriented to person, place, and time. Normal reflexes, muscle tone coordination. No cranial nerve deficit noted. PSYCHIATRIC: Normal mood and affect. Normal behavior. Normal judgment and thought  content. CARDIOVASCULAR: Normal heart rate noted, regular rhythm RESPIRATORY: Effort and breath sounds normal, no problems with respiration noted MUSCULOSKELETAL: Normal range of motion. No edema and no tenderness. 2+ distal pulses. ABDOMEN: Soft, nontender, nondistended, gravid. CERVIX:    Fetal monitoring: FHR: 135 bpm, Variability: moderate, Accelerations: Present, Decelerations: Absent  Uterine activity: None contractions per hour  Significant Diagnostic Studies:  Results for orders placed or performed during the hospital encounter of 06/17/20 (from the past 168 hour(s))  SARS Coronavirus 2 by RT PCR (hospital order, performed in [place] hospital lab) Nasopharyngeal Nasopharyngeal Swab   Collection Time: 06/17/20  2:10 PM   Specimen: Nasopharyngeal Swab  Result Value Ref Range   SARS Coronavirus 2 POSITIVE (A) NEGATIVE  CBC with Differential/Platelet   Collection Time: 06/17/20  2:14 PM  Result Value Ref Range   WBC 5.6 4.0 - 10.5 K/uL   RBC 3.42 (L) 3.87 - 5.11 MIL/uL   Hemoglobin 10.2 (L) 12.0 - 15.0 g/dL   HCT 31.7 (L) 36 - 46 %   MCV 92.7 80.0 - 100.0 fL   MCH 29.8 26.0 - 34.0 pg   MCHC 32.2 30.0 - 36.0 g/dL   RDW 14.5 11.5 - 15.5 %   Platelets 274 150 - 400 K/uL   nRBC 0.5 (H) 0.0 - 0.2 %   Neutrophils Relative % 73 %   Neutro Abs 4.2 1.7 - 7.7 K/uL   Lymphocytes Relative 12 %   Lymphs Abs 0.7 0.7 - 4.0 K/uL   Monocytes Relative 11 %   Monocytes Absolute 0.6 0 - 1 K/uL   Eosinophils Relative 1 %   Eosinophils Absolute 0.0 0 - 0 K/uL   Basophils Relative 1 %  Basophils Absolute 0.0 0 - 0 K/uL   Immature Granulocytes 2 %   Abs Immature Granulocytes 0.09 (H) 0.00 - 0.07 K/uL  Comprehensive metabolic panel   Collection Time: 06/17/20  2:14 PM  Result Value Ref Range   Sodium 136 135 - 145 mmol/L   Potassium 3.9 3.5 - 5.1 mmol/L   Chloride 107 98 - 111 mmol/L   CO2 19 (L) 22 - 32 mmol/L   Glucose, Bld 105 (H) 70 - 99 mg/dL   BUN <5 (L) 6 - 20 mg/dL    Creatinine, Ser 0.44 0.44 - 1.00 mg/dL   Calcium 9.1 8.9 - 10.3 mg/dL   Total Protein 5.7 (L) 6.5 - 8.1 g/dL   Albumin 2.7 (L) 3.5 - 5.0 g/dL   AST 22 15 - 41 U/L   ALT 15 0 - 44 U/L   Alkaline Phosphatase 226 (H) 38 - 126 U/L   Total Bilirubin 0.6 0.3 - 1.2 mg/dL   GFR calc non Af Amer >60 >60 mL/min   GFR calc Af Amer >60 >60 mL/min   Anion gap 10 5 - 15  Lactic acid, plasma   Collection Time: 06/17/20  2:20 PM  Result Value Ref Range   Lactic Acid, Venous 1.3 0.5 - 1.9 mmol/L  Type and screen [place]   Collection Time: 06/17/20  2:20 PM  Result Value Ref Range   ABO/RH(D) O POS    Antibody Screen NEG    Sample Expiration      06/20/2020,2359 Performed at [facility], [address] [place], [place] [address]   Urinalysis, Routine w reflex microscopic Urine, Clean Catch   Collection Time: 06/17/20  5:38 PM  Result Value Ref Range   Color, Urine YELLOW YELLOW   APPearance CLEAR CLEAR   Specific Gravity, Urine 1.010 1.005 - 1.030   pH 6.0 5.0 - 8.0   Glucose, UA NEGATIVE NEGATIVE mg/dL   Hgb urine dipstick NEGATIVE NEGATIVE   Bilirubin Urine NEGATIVE NEGATIVE   Ketones, ur 20 (A) NEGATIVE mg/dL   Protein, ur NEGATIVE NEGATIVE mg/dL   Nitrite NEGATIVE NEGATIVE   Leukocytes,Ua TRACE (A) NEGATIVE   WBC, UA 0-5 0 - 5 WBC/hpf   Bacteria, UA RARE (A) NONE SEEN   Squamous Epithelial / LPF 0-5 0 - 5   Mucus PRESENT   C-reactive protein   Collection Time: 06/17/20  5:40 PM  Result Value Ref Range   CRP 1.1 (H) <1.0 mg/dL  Ferritin   Collection Time: 06/17/20  5:40 PM  Result Value Ref Range   Ferritin 11 11 - 307 ng/mL  Fibrinogen   Collection Time: 06/17/20  5:40 PM  Result Value Ref Range   Fibrinogen 526 (H) 210 - 475 mg/dL  Lactate dehydrogenase   Collection Time: 06/17/20  5:40 PM  Result Value Ref Range   LDH 159 98 - 192 U/L  Glucose, capillary   Collection Time: 06/17/20  7:59 PM  Result Value Ref Range   Glucose-Capillary 74 70  - 99 mg/dL  Glucose, capillary   Collection Time: 06/17/20  9:48 PM  Result Value Ref Range   Glucose-Capillary 148 (H) 70 - 99 mg/dL  VITAMIN D 25 Hydroxy (Vit-D Deficiency, Fractures)   Collection Time: 06/17/20 10:30 PM  Result Value Ref Range   Vit D, 25-Hydroxy 14.60 (L) 30 - 100 ng/mL  CBC with Differential/Platelet   Collection Time: 06/18/20  6:26 AM  Result Value Ref Range   WBC 5.3 4.0 -  10.5 K/uL   RBC 3.04 (L) 3.87 - 5.11 MIL/uL   Hemoglobin 9.0 (L) 12.0 - 15.0 g/dL   HCT 98.9 (L) 36 - 46 %   MCV 91.4 80.0 - 100.0 fL   MCH 29.6 26.0 - 34.0 pg   MCHC 32.4 30.0 - 36.0 g/dL   RDW 34.1 49.2 - 18.8 %   Platelets 240 150 - 400 K/uL   nRBC 0.6 (H) 0.0 - 0.2 %   Neutrophils Relative % 70 %   Neutro Abs 3.7 1.7 - 7.7 K/uL   Lymphocytes Relative 21 %   Lymphs Abs 1.1 0.7 - 4.0 K/uL   Monocytes Relative 7 %   Monocytes Absolute 0.4 0 - 1 K/uL   Eosinophils Relative 0 %   Eosinophils Absolute 0.0 0 - 0 K/uL   Basophils Relative 0 %   Basophils Absolute 0.0 0 - 0 K/uL   Immature Granulocytes 2 %   Abs Immature Granulocytes 0.09 (H) 0.00 - 0.07 K/uL  Comprehensive metabolic panel   Collection Time: 06/18/20  6:26 AM  Result Value Ref Range   Sodium 136 135 - 145 mmol/L   Potassium 3.6 3.5 - 5.1 mmol/L   Chloride 105 98 - 111 mmol/L   CO2 20 (L) 22 - 32 mmol/L   Glucose, Bld 123 (H) 70 - 99 mg/dL   BUN 6 6 - 20 mg/dL   Creatinine, Ser 6.36 (L) 0.44 - 1.00 mg/dL   Calcium 8.8 (L) 8.9 - 10.3 mg/dL   Total Protein 4.9 (L) 6.5 - 8.1 g/dL   Albumin 2.3 (L) 3.5 - 5.0 g/dL   AST 20 15 - 41 U/L   ALT 15 0 - 44 U/L   Alkaline Phosphatase 206 (H) 38 - 126 U/L   Total Bilirubin 0.6 0.3 - 1.2 mg/dL   GFR calc non Af Amer >60 >60 mL/min   GFR calc Af Amer >60 >60 mL/min   Anion gap 11 5 - 15  C-reactive protein   Collection Time: 06/18/20  6:26 AM  Result Value Ref Range   CRP 1.2 (H) <1.0 mg/dL  D-dimer, quantitative (not at [facility])   Collection Time: 06/18/20  6:26 AM   Result Value Ref Range   D-Dimer, Quant 8.79 (H) 0.00 - 0.50 ug/mL-FEU  Magnesium   Collection Time: 06/18/20  6:26 AM  Result Value Ref Range   Magnesium 1.6 (L) 1.7 - 2.4 mg/dL  Ferritin   Collection Time: 06/18/20  6:26 AM  Result Value Ref Range   Ferritin 11 11 - 307 ng/mL  Glucose, capillary   Collection Time: 06/18/20 10:01 AM  Result Value Ref Range   Glucose-Capillary 82 70 - 99 mg/dL  Glucose, capillary   Collection Time: 06/18/20  2:50 PM  Result Value Ref Range   Glucose-Capillary 150 (H) 70 - 99 mg/dL  Glucose, capillary   Collection Time: 06/18/20  6:21 PM  Result Value Ref Range   Glucose-Capillary 115 (H) 70 - 99 mg/dL  Glucose, capillary   Collection Time: 06/18/20 11:36 PM  Result Value Ref Range   Glucose-Capillary 157 (H) 70 - 99 mg/dL  CBC with Differential/Platelet   Collection Time: 06/19/20  6:08 AM  Result Value Ref Range   WBC 5.0 4.0 - 10.5 K/uL   RBC 3.20 (L) 3.87 - 5.11 MIL/uL   Hemoglobin 9.8 (L) 12.0 - 15.0 g/dL   HCT 11.0 (L) 36 - 46 %   MCV 92.8 80.0 - 100.0 fL   MCH 30.6  26.0 - 34.0 pg   MCHC 33.0 30.0 - 36.0 g/dL   RDW 14.6 11.5 - 15.5 %   Platelets 253 150 - 400 K/uL   nRBC 0.8 (H) 0.0 - 0.2 %   Neutrophils Relative % 66 %   Neutro Abs 3.3 1.7 - 7.7 K/uL   Lymphocytes Relative 23 %   Lymphs Abs 1.1 0.7 - 4.0 K/uL   Monocytes Relative 9 %   Monocytes Absolute 0.5 0 - 1 K/uL   Eosinophils Relative 0 %   Eosinophils Absolute 0.0 0 - 0 K/uL   Basophils Relative 0 %   Basophils Absolute 0.0 0 - 0 K/uL   Immature Granulocytes 2 %   Abs Immature Granulocytes 0.10 (H) 0.00 - 0.07 K/uL  Comprehensive metabolic panel   Collection Time: 06/19/20  6:08 AM  Result Value Ref Range   Sodium 137 135 - 145 mmol/L   Potassium 3.7 3.5 - 5.1 mmol/L   Chloride 106 98 - 111 mmol/L   CO2 21 (L) 22 - 32 mmol/L   Glucose, Bld 154 (H) 70 - 99 mg/dL   BUN 8 6 - 20 mg/dL   Creatinine, Ser 0.55 0.44 - 1.00 mg/dL   Calcium 8.7 (L) 8.9 - 10.3 mg/dL    Total Protein 5.1 (L) 6.5 - 8.1 g/dL   Albumin 2.4 (L) 3.5 - 5.0 g/dL   AST 21 15 - 41 U/L   ALT 16 0 - 44 U/L   Alkaline Phosphatase 208 (H) 38 - 126 U/L   Total Bilirubin 0.5 0.3 - 1.2 mg/dL   GFR calc non Af Amer >60 >60 mL/min   GFR calc Af Amer >60 >60 mL/min   Anion gap 10 5 - 15  C-reactive protein   Collection Time: 06/19/20  6:08 AM  Result Value Ref Range   CRP 1.0 (H) <1.0 mg/dL  D-dimer, quantitative (not at [facility])   Collection Time: 06/19/20  6:08 AM  Result Value Ref Range   D-Dimer, Quant 6.29 (H) 0.00 - 0.50 ug/mL-FEU  Magnesium   Collection Time: 06/19/20  6:08 AM  Result Value Ref Range   Magnesium 1.6 (L) 1.7 - 2.4 mg/dL  Ferritin   Collection Time: 06/19/20  6:08 AM  Result Value Ref Range   Ferritin 14 11 - 307 ng/mL  Glucose, capillary   Collection Time: 06/19/20 11:49 AM  Result Value Ref Range   Glucose-Capillary 108 (H) 70 - 99 mg/dL  Results for orders placed or performed during the hospital encounter of 06/16/20 (from the past 168 hour(s))  POCT fern test   Collection Time: 06/16/20  6:31 PM  Result Value Ref Range   POCT Fern Test Negative = intact amniotic membranes   Amnisure rupture of membrane (rom)not at [facility]   Collection Time: 06/16/20  6:49 PM  Result Value Ref Range   Amnisure ROM NEGATIVE    DG CHEST PORT 1 VIEW  Result Date: 06/17/2020 CLINICAL DATA:  Chills and body aches, COVID-19 positivity EXAM: PORTABLE CHEST 1 VIEW COMPARISON:  None. FINDINGS: Cardiac shadows within normal limits. The lungs are well aerated bilaterally. Minimal patchy opacities are noted particularly in the right lung likely related to the given clinical history. No bony abnormality is noted. IMPRESSION: Patchy opacities consistent with the given clinical history of COVID-19 positivity. Electronically Signed   By: Inez Catalina M.D.   On: 06/17/2020 17:38   VAS [place] LOWER EXTREMITY VENOUS (DVT)  Result Date: 06/18/2020  Lower Venous DVTStudy Indications: Pain,  Swelling, and covid +.  Performing Technologist: Abram Sander RVS  Examination Guidelines: A complete evaluation includes B-mode imaging, spectral Doppler, color Doppler, and power Doppler as needed of all accessible portions of each vessel. Bilateral testing is considered an integral part of a complete examination. Limited examinations for reoccurring indications may be performed as noted. The reflux portion of the exam is performed with the patient in reverse Trendelenburg.  +---------+---------------+---------+-----------+----------+--------------+ RIGHT    CompressibilityPhasicitySpontaneityPropertiesThrombus Aging +---------+---------------+---------+-----------+----------+--------------+ CFV      Full           Yes      Yes                                 +---------+---------------+---------+-----------+----------+--------------+ SFJ      Full                                                        +---------+---------------+---------+-----------+----------+--------------+ FV Prox  Full                                                        +---------+---------------+---------+-----------+----------+--------------+ FV Mid   Full                                                        +---------+---------------+---------+-----------+----------+--------------+ FV DistalFull                                                        +---------+---------------+---------+-----------+----------+--------------+ PFV      Full                                                        +---------+---------------+---------+-----------+----------+--------------+ POP      Full           Yes      Yes                                 +---------+---------------+---------+-----------+----------+--------------+ PTV      Full                                                        +---------+---------------+---------+-----------+----------+--------------+ PERO     Full                                                         +---------+---------------+---------+-----------+----------+--------------+   +---------+---------------+---------+-----------+----------+--------------+  LEFT     CompressibilityPhasicitySpontaneityPropertiesThrombus Aging +---------+---------------+---------+-----------+----------+--------------+ CFV      Full           Yes      Yes                                 +---------+---------------+---------+-----------+----------+--------------+ SFJ      Full                                                        +---------+---------------+---------+-----------+----------+--------------+ FV Prox  Full                                                        +---------+---------------+---------+-----------+----------+--------------+ FV Mid   Full                                                        +---------+---------------+---------+-----------+----------+--------------+ FV DistalFull                                                        +---------+---------------+---------+-----------+----------+--------------+ PFV      Full                                                        +---------+---------------+---------+-----------+----------+--------------+ POP      Full           Yes      Yes                                 +---------+---------------+---------+-----------+----------+--------------+ PTV      Full                                                        +---------+---------------+---------+-----------+----------+--------------+ PERO     Full                                                        +---------+---------------+---------+-----------+----------+--------------+     Summary: BILATERAL: - No evidence of deep vein thrombosis seen in the lower extremities, bilaterally. - No evidence of superficial venous thrombosis in the lower extremities, bilaterally. -   *See table(s) above for  measurements and observations. Electronically signed by Monica Martinez MD  on 06/18/2020 at 3:21:32 PM.    Final     Future Appointments  Date Time Provider [place]  06/23/2020  9:35 AM Gabriel Carina, CNM [place] None    Discharge Condition: Stable  Discharge disposition: 01-Home or Self Care       Discharge Instructions    Discharge activity:  Bathroom / Shower only   Complete by: As directed    Discharge activity: Bedrest   Complete by: As directed    Discharge diet:   Complete by: As directed    Watch carbs   Fetal Kick Count:  Lie on our left side for one hour after a meal, and count the number of times your baby kicks.  If it is less than 5 times, get up, move around and drink some juice.  Repeat the test 30 minutes later.  If it is still less than 5 kicks in an hour, notify your doctor.   Complete by: As directed    LABOR:  When conractions begin, you should start to time them from the beginning of one contraction to the beginning  of the next.  When contractions are 5 - 10 minutes apart or less and have been regular for at least an hour, you should call your health care provider.   Complete by: As directed    Notify physician for bleeding from the vagina   Complete by: As directed    Notify physician for blurring of vision or spots before the eyes   Complete by: As directed    Notify physician for chills or fever   Complete by: As directed    Notify physician for fainting spells, "black outs" or loss of consciousness   Complete by: As directed    Notify physician for increase in vaginal discharge   Complete by: As directed    Notify physician for leaking of fluid   Complete by: As directed    Notify physician for pain or burning when urinating   Complete by: As directed    Notify physician for pelvic pressure (sudden increase)   Complete by: As directed    Notify physician for severe or continued nausea or vomiting   Complete by: As directed     Notify physician for sudden gushing of fluid from the vagina (with or without continued leaking)   Complete by: As directed    Notify physician for sudden, constant, or occasional abdominal pain   Complete by: As directed    Notify physician if baby moving less than usual   Complete by: As directed      Allergies as of 06/19/2020   No Known Allergies     Medication List    TAKE these medications   acetaminophen 500 MG tablet Commonly known as: TYLENOL Take 500 mg by mouth every 6 (six) hours as needed for mild pain or headache.   Blood Pressure Kit Devi 1 kit by Does not apply route as needed.   butalbital-acetaminophen-caffeine 50-325-40 MG tablet Commonly known as: FIORICET Take 1-2 tablets by mouth every 6 (six) hours as needed for headache.   Comfort Fit Maternity Supp Med Misc 1 Device by Does not apply route daily.   enoxaparin 40 MG/0.4ML injection Commonly known as: LOVENOX Inject 0.4 mLs (40 mg total) into the skin daily for 7 days.   famotidine 20 MG tablet Commonly known as: Pepcid Take 1 tablet (20 mg total) by mouth 2 (two) times daily.   polyvinyl alcohol 1.4 % ophthalmic solution Commonly  known as: LIQUIFILM TEARS Place 1 drop into both eyes as needed for dry eyes.   predniSONE 10 MG tablet Commonly known as: DELTASONE Take 1 tablet (10 mg total) by mouth daily with breakfast for 7 days. 40 mg x 2 days, then 30 mg x 2 days, then 20 mg x 2 days, then 10 mg   promethazine 25 MG tablet Commonly known as: PHENERGAN Take 1 tablet (25 mg total) by mouth every 6 (six) hours as needed for nausea or vomiting.   Vitafol Gummies 3.33-0.333-34.8 MG Chew Chew 3 each by mouth daily.       Follow-up Information    [place] Follow up.   Why: keep next appointment--will change to virtual Contact information: [place] [id number] [phone]              Signed: Donnamae Jude M.D. 06/19/2020,  12:09 PM

## 2020-06-19 NOTE — Discharge Instructions (Signed)
Patient scheduled for outpatient Remdesivir infusions at 10am on Saturday 8/28 and Sunday 8/29 at [facility]. Please inform the patient to park at [address] [place], [place], as staff will be escorting the patient through the east entrance of the hospital.    There is a wave flag banner located near the entrance on [address]. Turn into this entrance and immediately turn left and park in 1 of the 5 designated Covid Infusion Parking spots. There is a phone number on the sign, please call and let the staff know what spot you are in and we will come out and get you. For questions call [phone].  Thanks.  10 Things You Can Do to Manage Your COVID-19 Symptoms at Home If you have possible or confirmed COVID-19: 1. Stay home from work and school. And stay away from other public places. If you must go out, avoid using any kind of public transportation, ridesharing, or taxis. 2. Monitor your symptoms carefully. If your symptoms get worse, call your healthcare provider immediately. 3. Get rest and stay hydrated. 4. If you have a medical appointment, call the healthcare provider ahead of time and tell them that you have or may have COVID-19. 5. For medical emergencies, call 911 and notify the dispatch personnel that you have or may have COVID-19. 6. Cover your cough and sneezes with a tissue or use the inside of your elbow. 7. Wash your hands often with soap and water for at least 20 seconds or clean your hands with an alcohol-based hand sanitizer that contains at least 60% alcohol. 8. As much as possible, stay in a specific room and away from other people in your home. Also, you should use a separate bathroom, if available. If you need to be around other people in or outside of the home, wear a mask. 9. Avoid sharing personal items with other people in your household, like dishes, towels, and bedding. 10. Clean all surfaces that are touched often, like counters, tabletops, and doorknobs. Use  household cleaning sprays or wipes according to the label instructions. [employer] 04/24/2019 This information is not intended to replace advice given to you by your health care provider. Make sure you discuss any questions you have with your health care provider. Document Revised: 09/26/2019 Document Reviewed: 09/26/2019 Elsevier Patient Education  2020 [employer].   Prevention Steps for Caregivers and Household Members of Individuals Confirmed to have, or Being Evaluated for, COVID-19 Infection Being Cared for in the Home  If you live with, or provide care at home for, a person confirmed to have, or being evaluated for, COVID-19 infection please follow these guidelines to prevent infection:  Follow healthcare provider's instructions Make sure that you understand and can help the patient follow any healthcare provider instructions for all care.  Provide for the patient's basic needs You should help the patient with basic needs in the home and provide support for getting groceries, prescriptions, and other personal needs.  Monitor the patient's symptoms If they are getting sicker, call his or her medical provider and tell them that the patient has, or is being evaluated for, COVID-19 infection. This will help the healthcare provider's office take steps to keep other people from getting infected. Ask the healthcare provider to call the local or state health department.  Limit the number of people who have contact with the patient  If possible, have only one caregiver for the patient.  Other household members should stay in another home or place of  residence. If this is not possible, they should stay  in another room, or be separated from the patient as much as possible. Use a separate bathroom, if available.  Restrict visitors who do not have an essential need to be in the home.  Keep older adults, very young children, and other sick people away from the patient Keep  older adults, very young children, and those who have compromised immune systems or chronic health conditions away from the patient. This includes people with chronic heart, lung, or kidney conditions, diabetes, and cancer.  Ensure good ventilation Make sure that shared spaces in the home have good air flow, such as from an air conditioner or an opened window, weather permitting.  Wash your hands often  Wash your hands often and thoroughly with soap and water for at least 20 seconds. You can use an alcohol based hand sanitizer if soap and water are not available and if your hands are not visibly dirty.  Avoid touching your eyes, nose, and mouth with unwashed hands.  Use disposable paper towels to dry your hands. If not available, use dedicated cloth towels and replace them when they become wet.  Wear a facemask and gloves  Wear a disposable facemask at all times in the room and gloves when you touch or have contact with the patient's blood, body fluids, and/or secretions or excretions, such as sweat, saliva, sputum, nasal mucus, vomit, urine, or feces.  Ensure the mask fits over your nose and mouth tightly, and do not touch it during use.  Throw out disposable facemasks and gloves after using them. Do not reuse.  Wash your hands immediately after removing your facemask and gloves.  If your personal clothing becomes contaminated, carefully remove clothing and launder. Wash your hands after handling contaminated clothing.  Place all used disposable facemasks, gloves, and other waste in a lined container before disposing them with other household waste.  Remove gloves and wash your hands immediately after handling these items.  Do not share dishes, glasses, or other household items with the patient  Avoid sharing household items. You should not share dishes, drinking glasses, cups, eating utensils, towels, bedding, or other items with a patient who is confirmed to have, or being evaluated  for, COVID-19 infection.  After the person uses these items, you should wash them thoroughly with soap and water.  Wash laundry thoroughly  Immediately remove and wash clothes or bedding that have blood, body fluids, and/or secretions or excretions, such as sweat, saliva, sputum, nasal mucus, vomit, urine, or feces, on them.  Wear gloves when handling laundry from the patient.  Read and follow directions on labels of laundry or clothing items and detergent. In general, wash and dry with the warmest temperatures recommended on the label.  Clean all areas the individual has used often  Clean all touchable surfaces, such as counters, tabletops, doorknobs, bathroom fixtures, toilets, phones, keyboards, tablets, and bedside tables, every day. Also, clean any surfaces that may have blood, body fluids, and/or secretions or excretions on them.  Wear gloves when cleaning surfaces the patient has come in contact with.  Use a diluted bleach solution (e.g., dilute bleach with 1 part bleach and 10 parts water) or a household disinfectant with a label that says EPA-registered for coronaviruses. To make a bleach solution at home, add 1 tablespoon of bleach to 1 quart (4 cups) of water. For a larger supply, add  cup of bleach to 1 gallon (16 cups) of water.  Read labels  of cleaning products and follow recommendations provided on product labels. Labels contain instructions for safe and effective use of the cleaning product including precautions you should take when applying the product, such as wearing gloves or eye protection and making sure you have good ventilation during use of the product.  Remove gloves and wash hands immediately after cleaning.  Monitor yourself for signs and symptoms of illness Caregivers and household members are considered close contacts, should monitor their health, and will be asked to limit movement outside of the home to the extent possible. Follow the monitoring steps for  close contacts listed on the symptom monitoring form.   ? If you have additional questions, contact your local health department or call the epidemiologist on call at [phone] (available 24/7). ? This guidance is subject to change. For the most up-to-date guidance from [facility], please refer to their website: [email or web address]

## 2020-06-20 ENCOUNTER — Ambulatory Visit (HOSPITAL_COMMUNITY)
Admit: 2020-06-20 | Discharge: 2020-06-20 | Disposition: A | Discharge status: Home / Self Care | Payer: Medicaid Other | Attending: Pulmonary Disease | Admitting: Pulmonary Disease

## 2020-06-20 DIAGNOSIS — U071 COVID-19: Secondary | ICD-10-CM | POA: Diagnosis present

## 2020-06-20 MED ORDER — EPINEPHRINE 0.3 MG/0.3ML IJ SOAJ: 0.3000 mg | Freq: Once | INTRAMUSCULAR | Status: DC | PRN

## 2020-06-20 MED ORDER — FAMOTIDINE IN NACL 20-0.9 MG/50ML-% IV SOLN: 20.0000 mg | Freq: Once | INTRAVENOUS | Status: DC | PRN

## 2020-06-20 MED ORDER — SODIUM CHLORIDE 0.9 % IV SOLN: INTRAVENOUS | Status: DC | PRN

## 2020-06-20 MED ORDER — ALBUTEROL SULFATE HFA 108 (90 BASE) MCG/ACT IN AERS: 2.0000 | INHALATION_SPRAY | Freq: Once | RESPIRATORY_TRACT | Status: DC | PRN

## 2020-06-20 MED ORDER — DIPHENHYDRAMINE HCL 50 MG/ML IJ SOLN: 50.0000 mg | Freq: Once | INTRAMUSCULAR | Status: DC | PRN

## 2020-06-20 MED ORDER — SODIUM CHLORIDE 0.9 % IV SOLN
100.0000 mg | Freq: Once | INTRAVENOUS | Status: AC
  Administered 2020-06-20: 100 mg via INTRAVENOUS
  Filled 2020-06-20: qty 20

## 2020-06-20 MED ORDER — METHYLPREDNISOLONE SODIUM SUCC 125 MG IJ SOLR: 125.0000 mg | Freq: Once | INTRAMUSCULAR | Status: DC | PRN

## 2020-06-20 NOTE — Progress Notes (Signed)
  Diagnosis: COVID-19  Physician: Patrick Wright, MD  Procedure: Covid Infusion Clinic Med: remdesivir infusion - Provided patient with remdesivir fact sheet for patients, parents and caregivers prior to infusion.  Complications: No immediate complications noted.  Discharge: Discharged home   Jeanette Becker Jeanette Becker 06/20/2020  

## 2020-06-20 NOTE — Progress Notes (Deleted)
  Diagnosis: COVID-19  Physician: Dr. Wright  Procedure: Covid Infusion Clinic Med: remdesivir infusion - Provided patient with remdesivir fact sheet for patients, parents and caregivers prior to infusion.  Complications: No immediate complications noted.  Discharge: Discharged home   Jeanette Becker 06/20/2020   

## 2020-06-20 NOTE — Discharge Instructions (Signed)

## 2020-06-21 ENCOUNTER — Ambulatory Visit (HOSPITAL_COMMUNITY)
Admit: 2020-06-21 | Discharge: 2020-06-21 | Disposition: A | Discharge status: Home / Self Care | Payer: Medicaid Other | Source: Ambulatory Visit | Attending: Pulmonary Disease | Admitting: Pulmonary Disease

## 2020-06-21 DIAGNOSIS — U071 COVID-19: Secondary | ICD-10-CM | POA: Insufficient documentation

## 2020-06-21 DIAGNOSIS — J1282 Pneumonia due to coronavirus disease 2019: Secondary | ICD-10-CM | POA: Insufficient documentation

## 2020-06-21 MED ORDER — FAMOTIDINE IN NACL 20-0.9 MG/50ML-% IV SOLN: 20.0000 mg | Freq: Once | INTRAVENOUS | Status: DC | PRN

## 2020-06-21 MED ORDER — ALBUTEROL SULFATE HFA 108 (90 BASE) MCG/ACT IN AERS: 2.0000 | INHALATION_SPRAY | Freq: Once | RESPIRATORY_TRACT | Status: DC | PRN

## 2020-06-21 MED ORDER — METHYLPREDNISOLONE SODIUM SUCC 125 MG IJ SOLR: 125.0000 mg | Freq: Once | INTRAMUSCULAR | Status: DC | PRN

## 2020-06-21 MED ORDER — SODIUM CHLORIDE 0.9 % IV SOLN: INTRAVENOUS | Status: DC | PRN

## 2020-06-21 MED ORDER — EPINEPHRINE 0.3 MG/0.3ML IJ SOAJ: 0.3000 mg | Freq: Once | INTRAMUSCULAR | Status: DC | PRN

## 2020-06-21 MED ORDER — SODIUM CHLORIDE 0.9 % IV SOLN
100.0000 mg | Freq: Once | INTRAVENOUS | Status: AC
  Administered 2020-06-21: 100 mg via INTRAVENOUS
  Filled 2020-06-21: qty 20

## 2020-06-21 MED ORDER — DIPHENHYDRAMINE HCL 50 MG/ML IJ SOLN: 50.0000 mg | Freq: Once | INTRAMUSCULAR | Status: DC | PRN

## 2020-06-21 NOTE — Discharge Instructions (Signed)

## 2020-06-21 NOTE — Progress Notes (Signed)
  Diagnosis: COVID-19  Physician: Delford Field, MD  Procedure: Covid Infusion Clinic Med: remdesivir infusion - Provided patient with remdesivir fact sheet for patients, parents and caregivers prior to infusion.  Complications: No immediate complications noted.  Discharge: Discharged home   Jeanette Becker 06/21/2020

## 2020-06-23 ENCOUNTER — Telehealth (INDEPENDENT_AMBULATORY_CARE_PROVIDER_SITE_OTHER): Payer: Medicaid Other

## 2020-06-23 DIAGNOSIS — O98811 Other maternal infectious and parasitic diseases complicating pregnancy, first trimester: Secondary | ICD-10-CM

## 2020-06-23 DIAGNOSIS — A749 Chlamydial infection, unspecified: Secondary | ICD-10-CM

## 2020-06-23 DIAGNOSIS — Z3480 Encounter for supervision of other normal pregnancy, unspecified trimester: Secondary | ICD-10-CM

## 2020-06-23 NOTE — Progress Notes (Signed)
Staff able to contact with patient for virtual visit.  However, patient did not answer text link sent by provider.  When staff attempted to call patient, no answer received.  Will reschedule.

## 2020-06-23 NOTE — Progress Notes (Signed)
Pt. Is unable to take a blood pressure at this

## 2020-06-25 ENCOUNTER — Telehealth: Payer: Medicaid Other | Admitting: Obstetrics

## 2020-06-27 ENCOUNTER — Inpatient Hospital Stay (HOSPITAL_COMMUNITY)
Admission: AD | Admit: 2020-06-27 | Discharge: 2020-06-29 | DRG: 805 | Disposition: A | Discharge status: Home / Self Care | Payer: Medicaid Other | Attending: Obstetrics and Gynecology | Admitting: Obstetrics and Gynecology

## 2020-06-27 ENCOUNTER — Other Ambulatory Visit: Payer: Self-pay

## 2020-06-27 ENCOUNTER — Encounter (HOSPITAL_COMMUNITY): Payer: Self-pay | Admitting: Obstetrics and Gynecology

## 2020-06-27 ENCOUNTER — Inpatient Hospital Stay (HOSPITAL_COMMUNITY): Payer: Medicaid Other | Admitting: Anesthesiology

## 2020-06-27 DIAGNOSIS — Z3A39 39 weeks gestation of pregnancy: Secondary | ICD-10-CM

## 2020-06-27 DIAGNOSIS — O9852 Other viral diseases complicating childbirth: Secondary | ICD-10-CM | POA: Diagnosis present

## 2020-06-27 DIAGNOSIS — O4290 Premature rupture of membranes, unspecified as to length of time between rupture and onset of labor, unspecified weeks of gestation: Secondary | ICD-10-CM | POA: Diagnosis present

## 2020-06-27 DIAGNOSIS — U071 COVID-19: Secondary | ICD-10-CM | POA: Diagnosis present

## 2020-06-27 DIAGNOSIS — O4292 Full-term premature rupture of membranes, unspecified as to length of time between rupture and onset of labor: Secondary | ICD-10-CM | POA: Diagnosis present

## 2020-06-27 DIAGNOSIS — O4202 Full-term premature rupture of membranes, onset of labor within 24 hours of rupture: Secondary | ICD-10-CM

## 2020-06-27 LAB — CBC
HCT: 35.8 % — ABNORMAL LOW (ref 36.0–46.0)
Hemoglobin: 11.7 g/dL — ABNORMAL LOW (ref 12.0–15.0)
MCH: 29.6 pg (ref 26.0–34.0)
MCHC: 32.7 g/dL (ref 30.0–36.0)
MCV: 90.6 fL (ref 80.0–100.0)
Platelets: 300 10*3/uL (ref 150–400)
RBC: 3.95 MIL/uL (ref 3.87–5.11)
RDW: 14.3 % (ref 11.5–15.5)
WBC: 7.6 10*3/uL (ref 4.0–10.5)
nRBC: 0.8 % — ABNORMAL HIGH (ref 0.0–0.2)

## 2020-06-27 LAB — TYPE AND SCREEN
ABO/RH(D): O POS
Antibody Screen: NEGATIVE

## 2020-06-27 LAB — POCT FERN TEST: POCT Fern Test: POSITIVE

## 2020-06-27 MED ORDER — OXYTOCIN-SODIUM CHLORIDE 30-0.9 UT/500ML-% IV SOLN: 2.5000 [IU]/h | INTRAVENOUS | Status: DC

## 2020-06-27 MED ORDER — PHENYLEPHRINE 40 MCG/ML (10ML) SYRINGE FOR IV PUSH (FOR BLOOD PRESSURE SUPPORT)
80.0000 ug | PREFILLED_SYRINGE | INTRAVENOUS | Status: DC | PRN
  Filled 2020-06-27: qty 10

## 2020-06-27 MED ORDER — LACTATED RINGERS IV SOLN
500.0000 mL | Freq: Once | INTRAVENOUS | Status: AC
  Administered 2020-06-27: 500 mL via INTRAVENOUS

## 2020-06-27 MED ORDER — OXYCODONE-ACETAMINOPHEN 5-325 MG PO TABS: 2.0000 | ORAL_TABLET | ORAL | Status: DC | PRN

## 2020-06-27 MED ORDER — OXYCODONE-ACETAMINOPHEN 5-325 MG PO TABS: 1.0000 | ORAL_TABLET | ORAL | Status: DC | PRN

## 2020-06-27 MED ORDER — LACTATED RINGERS IV SOLN: INTRAVENOUS | Status: DC

## 2020-06-27 MED ORDER — FENTANYL CITRATE (PF) 2500 MCG/50ML IJ SOLN
INTRAMUSCULAR | Status: DC | PRN
Start: 2020-06-27 — End: 2020-06-28
  Administered 2020-06-27: 12 mL/h via EPIDURAL

## 2020-06-27 MED ORDER — EPHEDRINE 5 MG/ML INJ: 10.0000 mg | INTRAVENOUS | Status: DC | PRN

## 2020-06-27 MED ORDER — LACTATED RINGERS IV SOLN: 500.0000 mL | INTRAVENOUS | Status: DC | PRN

## 2020-06-27 MED ORDER — TERBUTALINE SULFATE 1 MG/ML IJ SOLN: 0.2500 mg | Freq: Once | INTRAMUSCULAR | Status: DC | PRN

## 2020-06-27 MED ORDER — FENTANYL-BUPIVACAINE-NACL 0.5-0.125-0.9 MG/250ML-% EP SOLN
12.0000 mL/h | EPIDURAL | Status: DC | PRN
  Filled 2020-06-27: qty 250

## 2020-06-27 MED ORDER — FLEET ENEMA 7-19 GM/118ML RE ENEM: 1.0000 | ENEMA | RECTAL | Status: DC | PRN

## 2020-06-27 MED ORDER — FENTANYL CITRATE (PF) 100 MCG/2ML IJ SOLN: 50.0000 ug | INTRAMUSCULAR | Status: DC | PRN

## 2020-06-27 MED ORDER — SOD CITRATE-CITRIC ACID 500-334 MG/5ML PO SOLN: 30.0000 mL | ORAL | Status: DC | PRN

## 2020-06-27 MED ORDER — OXYTOCIN-SODIUM CHLORIDE 30-0.9 UT/500ML-% IV SOLN
1.0000 m[IU]/min | INTRAVENOUS | Status: DC
  Administered 2020-06-27: 2 m[IU]/min via INTRAVENOUS
  Filled 2020-06-27: qty 500

## 2020-06-27 MED ORDER — HYDROXYZINE HCL 50 MG PO TABS: 50.0000 mg | ORAL_TABLET | Freq: Four times a day (QID) | ORAL | Status: DC | PRN

## 2020-06-27 MED ORDER — DIPHENHYDRAMINE HCL 50 MG/ML IJ SOLN: 12.5000 mg | INTRAMUSCULAR | Status: DC | PRN

## 2020-06-27 MED ORDER — LIDOCAINE HCL (PF) 1 % IJ SOLN
INTRAMUSCULAR | Status: DC | PRN
  Administered 2020-06-27 (×2): 4 mL via EPIDURAL

## 2020-06-27 MED ORDER — ONDANSETRON HCL 4 MG/2ML IJ SOLN: 4.0000 mg | Freq: Four times a day (QID) | INTRAMUSCULAR | Status: DC | PRN

## 2020-06-27 MED ORDER — PHENYLEPHRINE 40 MCG/ML (10ML) SYRINGE FOR IV PUSH (FOR BLOOD PRESSURE SUPPORT)
80.0000 ug | PREFILLED_SYRINGE | INTRAVENOUS | Status: DC | PRN
  Administered 2020-06-27: 80 ug via INTRAVENOUS

## 2020-06-27 MED ORDER — FENTANYL-BUPIVACAINE-NACL 0.5-0.125-0.9 MG/250ML-% EP SOLN: 12.0000 mL/h | EPIDURAL | Status: DC | PRN

## 2020-06-27 MED ORDER — LIDOCAINE HCL (PF) 1 % IJ SOLN: 30.0000 mL | INTRAMUSCULAR | Status: DC | PRN

## 2020-06-27 MED ORDER — OXYTOCIN BOLUS FROM INFUSION
333.0000 mL | Freq: Once | INTRAVENOUS | Status: AC
  Administered 2020-06-28: 333 mL via INTRAVENOUS

## 2020-06-27 MED ORDER — ACETAMINOPHEN 325 MG PO TABS: 650.0000 mg | ORAL_TABLET | ORAL | Status: DC | PRN

## 2020-06-27 NOTE — Anesthesia Procedure Notes (Signed)
Epidural Patient location during procedure: OB Start time: 06/27/2020 8:51 PM End time: 06/27/2020 9:10 PM  Staffing Anesthesiologist: Lewie Loron, MD Performed: anesthesiologist   Preanesthetic Checklist Completed: patient identified, IV checked, risks and benefits discussed, monitors and equipment checked, pre-op evaluation and timeout performed  Epidural Patient position: sitting Prep: DuraPrep and site prepped and draped Patient monitoring: heart rate, continuous pulse ox and blood pressure Approach: midline Location: L3-L4 Injection technique: LOR air and LOR saline  Needle:  Needle type: Tuohy  Needle gauge: 17 G Needle length: 9 cm Needle insertion depth: 5 cm Catheter type: closed end flexible Catheter size: 19 Gauge Catheter at skin depth: 11 cm Test dose: negative  Assessment Sensory level: T8 Events: blood aspirated, injection not painful, no injection resistance, no paresthesia and negative IV test  Additional Notes First attempt at L2-3. Ligaments soft, LOR difficult to detect. + paresthesia with saline LOR. Catheter threaded smoothly, but blood aspirated. Moved one level lower, single stick, LOR, good ligament feedback. Reason for block:procedure for pain

## 2020-06-27 NOTE — H&P (Signed)
Jeanette Becker is a 29 y.o. female G2P1001 with IUP at [redacted]w[redacted]d by LMP c/w 12 week [place] presenting for ROM at 1430. She reports positive fetal movement. She denies vaginal bleeding.  Prenatal History/Complications: PNC at [facility] 2025 w/o complications Chlamydia (treated, neg TOC)   Covid dx 10 days ago, hospitalized for 2 days  - Past Medical History: Past Medical History:  Diagnosis Date  . Medical history non-contributory     Past Surgical History: Past Surgical History:  Procedure Laterality Date  . NO PAST SURGERIES      Obstetrical History: OB History    Gravida  2   Para  1   Term  1   Preterm      AB      Living  1     SAB      TAB      Ectopic      Multiple      Live Births  1            Social History: Social History   Socioeconomic History  . Marital status: Single    Spouse name: Not on file  . Number of children: Not on file  . Years of education: Not on file  . Highest education level: Not on file  Occupational History  . Not on file  Tobacco Use  . Smoking status: Never Smoker  . Smokeless tobacco: Never Used  Vaping Use  . Vaping Use: Never used  Substance and Sexual Activity  . Alcohol use: No  . Drug use: No  . Sexual activity: Not Currently  Other Topics Concern  . Not on file  Social History Narrative  . Not on file   Social Determinants of Health   Financial Resource Strain:   . Difficulty of Paying Living Expenses: Not on file  Food Insecurity:   . Worried About Charity fundraiser in the Last Year: Not on file  . Ran Out of Food in the Last Year: Not on file  Transportation Needs:   . Lack of Transportation (Medical): Not on file  . Lack of Transportation (Non-Medical): Not on file  Physical Activity:   . Days of Exercise per Week: Not on file  . Minutes of Exercise per Session: Not on file  Stress:   . Feeling of Stress : Not on file  Social Connections:   . Frequency of Communication  with Friends and Family: Not on file  . Frequency of Social Gatherings with Friends and Family: Not on file  . Attends Religious Services: Not on file  . Active Member of Clubs or Organizations: Not on file  . Attends Archivist Meetings: Not on file  . Marital Status: Not on file    Family History: History reviewed. No pertinent family history.  Allergies: No Known Allergies  Medications Prior to Admission  Medication Sig Dispense Refill Last Dose  . acetaminophen (TYLENOL) 500 MG tablet Take 500 mg by mouth every 6 (six) hours as needed for mild pain or headache.     . Blood Pressure Monitoring (BLOOD PRESSURE KIT) DEVI 1 kit by Does not apply route as needed. 1 each 0   . butalbital-acetaminophen-caffeine (FIORICET) 50-325-40 MG tablet Take 1-2 tablets by mouth every 6 (six) hours as needed for headache. (Patient not taking: Reported on 03/10/2020) 20 tablet 0   . Elastic Bandages & Supports (COMFORT FIT MATERNITY SUPP MED) MISC 1 Device by Does not apply route daily. 1  each 0   . enoxaparin (LOVENOX) 40 MG/0.4ML injection Inject 0.4 mLs (40 mg total) into the skin daily for 7 days. 2.8 mL 0   . famotidine (PEPCID) 20 MG tablet Take 1 tablet (20 mg total) by mouth 2 (two) times daily. 60 tablet 2   . polyvinyl alcohol (LIQUIFILM TEARS) 1.4 % ophthalmic solution Place 1 drop into both eyes as needed for dry eyes.     . Prenatal Vit-Fe Phos-FA-Omega (VITAFOL GUMMIES) 3.33-0.333-34.8 MG CHEW Chew 3 each by mouth daily. 90 tablet 11   . promethazine (PHENERGAN) 25 MG tablet Take 1 tablet (25 mg total) by mouth every 6 (six) hours as needed for nausea or vomiting. (Patient not taking: Reported on 03/10/2020) 30 tablet 0     Review of Systems   Constitutional: Negative for fever and chills Eyes: Negative for visual disturbances Respiratory: Negative for shortness of breath, dyspnea Cardiovascular: Negative for chest pain or palpitations  Gastrointestinal: Negative for vomiting,  diarrhea and constipation.  POSITIVE for abdominal pain (contractions) Genitourinary: Negative for dysuria and urgency Musculoskeletal: Negative for back pain, joint pain, myalgias  Neurological: Negative for dizziness and headaches  Blood pressure 105/66, pulse 86, temperature 98.4 F (36.9 C), temperature source Oral, resp. rate 16, last menstrual period 09/27/2019, SpO2 98 %. General appearance: alert, cooperative and no distress Lungs: normal respiratory effort Heart: regular rate and rhythm Abdomen: soft, non-tender; bowel sounds normal Extremities: Homans sign is negative, no sign of DVT DTR's 2+ Presentation: cephalic Fetal monitoring  Baseline: 155 bpm, Variability: Good {> 6 bpm), Accelerations: Reactive and Decelerations: Absent Uterine activity  irregular Dilation: 5.5 Effacement (%): 80 Station: -2 Exam by:: Fredda Hammed RN    Prenatal labs: ABO, Rh: --/--/O POS (08/25 1420) Antibody: NEG (08/25 1420) Rubella: 3.07 (02/22 1520) RPR: Non Reactive (06/15 0939)  HBsAg: Negative (02/22 1520)  HIV: Non Reactive (06/15 0939)  GBS: Negative/-- (08/17 0105)   Nursing Staff Provider  Office Location  [facility] Dating  LMP  Language  English Anatomy US  Wnl  Flu Vaccine  Declined 12/16/19 Genetic Screen  NIPS:  Horizon Neg, Panorama Neg  AFP:  Screen neg   TDaP vaccine   info given 615-21 Hgb A1C or  GTT Early  Third trimester   Rhogam     LAB RESULTS   Feeding Plan Breast Blood Type O/Positive/-- (02/22 1520)   Contraception Patch Antibody Negative (02/22 1520)  Circumcision Yes if boy Rubella 3.07 (02/22 1520)  [facility] [facility] RPR Non Reactive (06/15 0939)   Support Person mom HBsAg Negative (02/22 1520)   Prenatal Classes  HIV Non Reactive (06/15 0939)  BTL Consent  GBS Negative/-- (08/17 0105)(For PCN allergy, check sensitivities)   VBAC Consent  Pap     Hgb Electro    BP Cuff Sent 12-09-19 CF     SMA     Waterbirth  $RemoveBef'[ ]'CWvAhDxrpd$  Class $Remo'[ ]'AtgLy$   Consent $Remove'[ ]'nOkOJSN$  CNM visit       Prenatal Transfer Tool  Maternal Diabetes: No Genetic Screening: Normal Maternal Ultrasounds/Referrals: Normal Fetal Ultrasounds or other Referrals:  None Maternal Substance Abuse:  No Significant Maternal Medications:  Lovenox, steroid taper Significant Maternal Lab Results: Group B Strep negative and Other: Covid + 8/25  Results for orders placed or performed during the hospital encounter of 06/27/20 (from the past 24 hour(s))  POCT fern test   Collection Time: 06/27/20  4:59 PM  Result Value Ref Range   POCT [place]  Test Positive = ruptured amniotic membanes     Assessment: Jeanette Becker is a 29 y.o. G2P1001 with an IUP at [redacted]w[redacted]d presenting for PROM  Plan: #Labor: expectant management #Pain:  Per request #FWB Cat 1 #ID: GBS: neg         Covid:  Off isolation tomorrow; DC lovenox until delivered, restart PP and continue for 7 days (per Dr. Virginia Crews note in DC summary), SCDs during ripening phase.  #MOF:  breast #MOC: OCPs #Circ: No   Christin Fudge 06/27/2020, 5:19 PM

## 2020-06-27 NOTE — MAU Note (Signed)
Pt reports to mau with c/o gush of fluid around 1430 today pt reports yellow fluid.  Pt denies ctx at this time.  Reports good fetal movement.

## 2020-06-27 NOTE — Anesthesia Preprocedure Evaluation (Signed)
Anesthesia Evaluation  Patient identified by MRN, date of birth, ID band Patient awake    Reviewed: Allergy & Precautions, Patient's Chart, lab work & pertinent test results  Airway Mallampati: II  TM Distance: >3 FB Neck ROM: Full    Dental no notable dental hx.    Pulmonary neg pulmonary ROS,    Pulmonary exam normal breath sounds clear to auscultation       Cardiovascular negative cardio ROS Normal cardiovascular exam Rhythm:Regular Rate:Normal     Neuro/Psych negative neurological ROS  negative psych ROS   GI/Hepatic negative GI ROS, Neg liver ROS,   Endo/Other  negative endocrine ROS  Renal/GU negative Renal ROS     Musculoskeletal negative musculoskeletal ROS (+)   Abdominal   Peds  Hematology negative hematology ROS (+)   Anesthesia Other Findings   Reproductive/Obstetrics (+) Pregnancy                             Anesthesia Physical Anesthesia Plan  ASA: III  Anesthesia Plan: Epidural   Post-op Pain Management:    Induction:   PONV Risk Score and Plan:   Airway Management Planned:   Additional Equipment:   Intra-op Plan:   Post-operative Plan:   Informed Consent: I have reviewed the patients History and Physical, chart, labs and discussed the procedure including the risks, benefits and alternatives for the proposed anesthesia with the patient or authorized representative who has indicated his/her understanding and acceptance.       Plan Discussed with:   Anesthesia Plan Comments: (Last dose of lovenox ~12 midnight last night (~21 hours ago).)        Anesthesia Quick Evaluation

## 2020-06-28 ENCOUNTER — Encounter (HOSPITAL_COMMUNITY): Payer: Self-pay | Admitting: Obstetrics and Gynecology

## 2020-06-28 DIAGNOSIS — Z3A39 39 weeks gestation of pregnancy: Secondary | ICD-10-CM

## 2020-06-28 LAB — RPR: RPR Ser Ql: NONREACTIVE

## 2020-06-28 MED ORDER — TRANEXAMIC ACID-NACL 1000-0.7 MG/100ML-% IV SOLN
INTRAVENOUS | Status: AC
  Administered 2020-06-28: 1000 mg
  Filled 2020-06-28: qty 100

## 2020-06-28 MED ORDER — ONDANSETRON HCL 4 MG PO TABS: 4.0000 mg | ORAL_TABLET | ORAL | Status: DC | PRN

## 2020-06-28 MED ORDER — ONDANSETRON HCL 4 MG/2ML IJ SOLN: 4.0000 mg | INTRAMUSCULAR | Status: DC | PRN

## 2020-06-28 MED ORDER — SENNOSIDES-DOCUSATE SODIUM 8.6-50 MG PO TABS
2.0000 | ORAL_TABLET | ORAL | Status: DC
  Administered 2020-06-28: 2 via ORAL
  Filled 2020-06-28: qty 2

## 2020-06-28 MED ORDER — ENOXAPARIN SODIUM 40 MG/0.4ML ~~LOC~~ SOLN
40.0000 mg | SUBCUTANEOUS | Status: DC
  Administered 2020-06-28 – 2020-06-29 (×2): 40 mg via SUBCUTANEOUS
  Filled 2020-06-28 (×2): qty 0.4

## 2020-06-28 MED ORDER — ZOLPIDEM TARTRATE 5 MG PO TABS: 5.0000 mg | ORAL_TABLET | Freq: Every evening | ORAL | Status: DC | PRN

## 2020-06-28 MED ORDER — TETANUS-DIPHTH-ACELL PERTUSSIS 5-2.5-18.5 LF-MCG/0.5 IM SUSP: 0.5000 mL | Freq: Once | INTRAMUSCULAR | Status: DC

## 2020-06-28 MED ORDER — COCONUT OIL OIL: 1.0000 "application " | TOPICAL_OIL | Status: DC | PRN

## 2020-06-28 MED ORDER — BENZOCAINE-MENTHOL 20-0.5 % EX AERO: 1.0000 "application " | INHALATION_SPRAY | CUTANEOUS | Status: DC | PRN

## 2020-06-28 MED ORDER — SIMETHICONE 80 MG PO CHEW: 80.0000 mg | CHEWABLE_TABLET | ORAL | Status: DC | PRN

## 2020-06-28 MED ORDER — TRANEXAMIC ACID-NACL 1000-0.7 MG/100ML-% IV SOLN: 1000.0000 mg | INTRAVENOUS | Status: DC

## 2020-06-28 MED ORDER — DIBUCAINE (PERIANAL) 1 % EX OINT: 1.0000 "application " | TOPICAL_OINTMENT | CUTANEOUS | Status: DC | PRN

## 2020-06-28 MED ORDER — PRENATAL MULTIVITAMIN CH
1.0000 | ORAL_TABLET | Freq: Every day | ORAL | Status: DC
  Administered 2020-06-28 – 2020-06-29 (×2): 1 via ORAL
  Filled 2020-06-28 (×2): qty 1

## 2020-06-28 MED ORDER — IBUPROFEN 600 MG PO TABS
600.0000 mg | ORAL_TABLET | Freq: Four times a day (QID) | ORAL | Status: DC
  Administered 2020-06-28 – 2020-06-29 (×6): 600 mg via ORAL
  Filled 2020-06-28 (×6): qty 1

## 2020-06-28 MED ORDER — DIPHENHYDRAMINE HCL 25 MG PO CAPS: 25.0000 mg | ORAL_CAPSULE | Freq: Four times a day (QID) | ORAL | Status: DC | PRN

## 2020-06-28 MED ORDER — WITCH HAZEL-GLYCERIN EX PADS: 1.0000 "application " | MEDICATED_PAD | CUTANEOUS | Status: DC | PRN

## 2020-06-28 MED ORDER — ACETAMINOPHEN 325 MG PO TABS: 650.0000 mg | ORAL_TABLET | ORAL | Status: DC | PRN

## 2020-06-28 NOTE — Anesthesia Postprocedure Evaluation (Signed)
Anesthesia Post Note  Patient: Jeanette Becker  Procedure(s) Performed: AN AD HOC LABOR EPIDURAL     Patient location during evaluation: Mother Baby Anesthesia Type: Epidural Level of consciousness: awake Pain management: satisfactory to patient Vital Signs Assessment: post-procedure vital signs reviewed and stable Respiratory status: spontaneous breathing Cardiovascular status: stable Anesthetic complications: no   No complications documented.  Last Vitals:  Vitals:   06/28/20 0500 06/28/20 0858  BP: 108/60 93/61  Pulse: 61 65  Resp: 16 17  Temp: 36.5 C 36.8 C  SpO2: 100%     Last Pain:  Vitals:   06/28/20 0900  TempSrc:   PainSc: 0-No pain   Pain Goal:                Epidural/Spinal Function Cutaneous sensation: Normal sensation (06/28/20 0900)  Cephus Shelling

## 2020-06-28 NOTE — Progress Notes (Signed)
Patient requested to use a double electric breast pump, pump supplies brought to bedside and set-up, instructed on use and to pump every 3 hours when not putting baby directly to the breast. Pt acknowledged instructions.

## 2020-06-28 NOTE — Discharge Summary (Signed)
Postpartum Discharge Summary       Patient Name: Jeanette Becker DOB: 1991-08-28 MRN: [id number]  Date of admission: 06/27/2020 Delivery date:06/28/2020  Delivering provider: Fatima Blank A  Date of discharge: 06/29/2020  Admitting diagnosis: PROM (premature rupture of membranes) [O42.90] Intrauterine pregnancy: [redacted]w[redacted]d     Secondary diagnosis:  Active Problems:   PROM (premature rupture of membranes)  Additional problems: Covid + 8/25    Discharge diagnosis: Term Pregnancy Delivered                                              Post partum procedures:none Augmentation: Pitocin Complications: None  Hospital course: Onset of Labor With Vaginal Delivery      29 y.o. yo G2P1001 at [redacted]w[redacted]d was admitted in Latent Labor on 06/27/2020. Patient had an uncomplicated labor course as follows:  Membrane Rupture Time/Date: 2:30 PM ,06/27/2020   Delivery Method:Vaginal, Spontaneous  Episiotomy: None  Lacerations:  None  Patient had an uncomplicated postpartum course.  She is ambulating, tolerating a regular diet, passing flatus, and urinating well. Patient is discharged home in stable condition on 06/29/20.  Newborn Data: Birth date:06/28/2020  Birth time:1:49 AM  Gender:Female  Living status:Living  Apgars:8 ,9  Weight:3121 g   Magnesium Sulfate received: No BMZ received: No Rhophylac:No MMR:No T-DaP:Given prenatally Flu: No Transfusion:No  Physical exam  Vitals:   06/28/20 1718 06/28/20 2007 06/28/20 2109 06/29/20 0558  BP: 101/71 1$RemoveBef'01/65 99/65 96/69 'bPOakcanmM$  Pulse: 72 66 70 66  Resp: $Remo'18 17 18 17  'TVrJd$ Temp: 97.7 F (36.5 C) 98.1 F (36.7 C) 98.7 F (37.1 C) 98.4 F (36.9 C)  TempSrc: Oral Oral Oral Oral  SpO2:  100% 99% 100%  Weight:      Height:       General: alert, cooperative and no distress Lochia: appropriate Uterine Fundus: firm Incision: N/A DVT Evaluation: No evidence of DVT seen on physical exam. Negative Homan's sign. No cords or calf tenderness. Labs: Lab  Results  Component Value Date   WBC 7.6 06/27/2020   HGB 11.7 (L) 06/27/2020   HCT 35.8 (L) 06/27/2020   MCV 90.6 06/27/2020   PLT 300 06/27/2020   CMP Latest Ref Rng & Units 06/19/2020  Glucose 70 - 99 mg/dL 154(H)  BUN 6 - 20 mg/dL 8  Creatinine 0.44 - 1.00 mg/dL 0.55  Sodium 135 - 145 mmol/L 137  Potassium 3.5 - 5.1 mmol/L 3.7  Chloride 98 - 111 mmol/L 106  CO2 22 - 32 mmol/L 21(L)  Calcium 8.9 - 10.3 mg/dL 8.7(L)  Total Protein 6.5 - 8.1 g/dL 5.1(L)  Total Bilirubin 0.3 - 1.2 mg/dL 0.5  Alkaline Phos 38 - 126 U/L 208(H)  AST 15 - 41 U/L 21  ALT 0 - 44 U/L 16   Edinburgh Score: Edinburgh Postnatal Depression Scale Screening Tool 06/28/2020  I have been able to laugh and see the funny side of things. 0  I have looked forward with enjoyment to things. 0  I have blamed myself unnecessarily when things went wrong. 1  I have been anxious or worried for no good reason. 2  I have felt scared or panicky for no good reason. 0  Things have been getting on top of me. 1  I have been so unhappy that I have had difficulty sleeping. 1  I have felt sad or miserable. 1  I have been so unhappy that I have been crying. 0  The thought of harming myself has occurred to me. 0  Edinburgh Postnatal Depression Scale Total 6     After visit meds:  Allergies as of 06/29/2020   No Known Allergies     Medication List    STOP taking these medications   acetaminophen 500 MG tablet Commonly known as: TYLENOL   Blood Pressure Kit Devi   butalbital-acetaminophen-caffeine 50-325-40 MG tablet Commonly known as: [place] Supp Med Misc   famotidine 20 MG tablet Commonly known as: Pepcid   polyvinyl alcohol 1.4 % ophthalmic solution Commonly known as: LIQUIFILM TEARS   promethazine 25 MG tablet Commonly known as: PHENERGAN     TAKE these medications   enoxaparin 40 MG/0.4ML injection Commonly known as: LOVENOX Inject 0.4 mLs (40 mg total) into the skin daily for 7  days.   ibuprofen 600 MG tablet Commonly known as: ADVIL Take 1 tablet (600 mg total) by mouth every 6 (six) hours.   norethindrone 0.35 MG tablet Commonly known as: MICRONOR Take 1 tablet (0.35 mg total) by mouth daily. Start the Sunday after the baby turns 51 weeks old   Vitafol Gummies 3.33-0.333-34.8 MG Chew Chew 3 each by mouth daily.        Discharge home in stable condition Infant Feeding: Bottle and Breast Infant Disposition:home with mother Discharge instruction: per After Visit Summary and Postpartum booklet. Activity: Advance as tolerated. Pelvic rest for 6 weeks.  Diet: routine diet Future Appointments:No future appointments. Follow up Visit:  [facility] Follow up.   Specialty: Obstetrics and Gynecology Contact information: [address] [place] [phone]              PP message sent to [place] on 06/28/20:  Please schedule this patient for a Virtual postpartum visit in 4 weeks with the following provider: Any provider. Additional Postpartum F/U:n/a  Low risk pregnancy complicated by: COVID positive 10 days before delivery Delivery mode:  Vaginal, Spontaneous  Anticipated Birth Control:  OCPs   06/29/2020 Christin Fudge, CNM

## 2020-06-29 MED ORDER — NORETHINDRONE 0.35 MG PO TABS: 1.0000 | ORAL_TABLET | Freq: Every day | ORAL | 11 refills | Status: DC

## 2020-06-29 MED ORDER — IBUPROFEN 600 MG PO TABS: 600.0000 mg | ORAL_TABLET | Freq: Four times a day (QID) | ORAL | 0 refills | Status: DC

## 2020-06-29 MED ORDER — ENOXAPARIN SODIUM 40 MG/0.4ML ~~LOC~~ SOLN: 40.0000 mg | SUBCUTANEOUS | 0 refills | Status: DC

## 2020-06-29 NOTE — Discharge Instructions (Signed)

## 2020-06-30 NOTE — Progress Notes (Signed)
Patient did not answer phone

## 2020-07-10 ENCOUNTER — Telehealth: Payer: Self-pay

## 2020-07-10 MED ORDER — BREAST PUMP MISC: 0 refills | Status: DC

## 2020-07-10 NOTE — Telephone Encounter (Signed)
Patient is requesting a breast pump to breast feed

## 2020-07-13 ENCOUNTER — Telehealth: Payer: Self-pay

## 2020-07-13 MED ORDER — BREAST PUMP MISC: 0 refills | Status: DC

## 2020-07-13 NOTE — Telephone Encounter (Signed)
Entered in error

## 2020-08-03 ENCOUNTER — Telehealth: Payer: Self-pay

## 2020-08-03 ENCOUNTER — Telehealth (INDEPENDENT_AMBULATORY_CARE_PROVIDER_SITE_OTHER): Payer: Medicaid Other | Admitting: Advanced Practice Midwife

## 2020-08-03 DIAGNOSIS — Z5320 Procedure and treatment not carried out because of patient's decision for unspecified reasons: Secondary | ICD-10-CM

## 2020-08-03 NOTE — Progress Notes (Signed)
Unable to reach pt for her virtual postpartum visit.  Pt Rx for micronor sent at discharge from hospital.  Will call pt to reschedule.

## 2020-08-03 NOTE — Telephone Encounter (Signed)
Unable to reach pt for virtual pp visit LVM x 2

## 2020-09-22 ENCOUNTER — Ambulatory Visit (INDEPENDENT_AMBULATORY_CARE_PROVIDER_SITE_OTHER): Payer: Medicaid Other | Admitting: Advanced Practice Midwife

## 2020-09-22 ENCOUNTER — Other Ambulatory Visit: Payer: Self-pay

## 2020-09-22 VITALS — BP 99/63 | HR 67 | Wt 149.0 lb

## 2020-09-22 DIAGNOSIS — Z01812 Encounter for preprocedural laboratory examination: Secondary | ICD-10-CM

## 2020-09-22 DIAGNOSIS — Z30017 Encounter for initial prescription of implantable subdermal contraceptive: Secondary | ICD-10-CM | POA: Diagnosis not present

## 2020-09-22 DIAGNOSIS — Z3202 Encounter for pregnancy test, result negative: Secondary | ICD-10-CM

## 2020-09-22 LAB — POCT URINE PREGNANCY: Preg Test, Ur: NEGATIVE

## 2020-09-22 MED ORDER — ETONOGESTREL 68 MG ~~LOC~~ IMPL
68.0000 mg | DRUG_IMPLANT | Freq: Once | SUBCUTANEOUS | Status: AC
Start: 2020-09-22 — End: 2020-09-22
  Administered 2020-09-22: 68 mg via SUBCUTANEOUS

## 2020-09-22 NOTE — Progress Notes (Signed)
Pt is interested in Nexplanon. Pt has not had intercourse since delivery in Sept.   UPT in office today is negative

## 2020-09-22 NOTE — Patient Instructions (Signed)
Nexplanon Instructions After Insertion  Keep bandage clean and dry for 24 hours  May use ice/Tylenol/Ibuprofen for soreness or pain  If you develop fever, drainage or increased warmth from incision site-contact office immediately   

## 2020-09-22 NOTE — Progress Notes (Signed)
Subjective:     Jeanette Becker is a 29 y.o. female who presents for a postpartum visit. She is 12 weeks postpartum following a spontaneous vaginal delivery. I have fully reviewed the prenatal and intrapartum course. The delivery was at 39.2 gestational weeks. Outcome: spontaneous vaginal delivery. Anesthesia: epidural. Postpartum course has been normal. Baby's course has been normal. Baby is feeding by both breast and bottle. Bleeding no bleeding. Bowel function is normal. Bladder function is normal. Patient is not sexually active. Contraception method is abstinence. Postpartum depression screening: negative.  The following portions of the patient's history were reviewed and updated as appropriate: allergies, current medications, past family history, past medical history, past social history, past surgical history and problem list.  Review of Systems Pertinent items noted in HPI and remainder of comprehensive ROS otherwise negative.   Objective:    BP 99/63   Pulse 67   Wt 149 lb (67.6 kg)   LMP 09/14/2020 (Approximate)   BMI 29.10 kg/m    VS reviewed, nursing note reviewed,  Constitutional: well developed, well nourished, no distress HEENT: normocephalic CV: normal rate Pulm/chest wall: normal effort Abdomen: soft Neuro: alert and oriented x 3 Skin: warm, dry Psych: affect normal    Nexplanon Insertion Procedure Patient identified, informed consent performed, consent signed.   Patient does understand that irregular bleeding is a very common side effect of this medication. She was advised to have backup contraception for one week after placement. Pregnancy test in clinic today was negative.  Appropriate time out taken.  Patient's left arm was prepped and draped in the usual sterile fashion.. The ruler used to measure and mark insertion area.  Patient was prepped with alcohol swab and then injected with 3 ml of 1% lidocaine.  She was prepped with betadine, Nexplanon removed from  packaging,  Device confirmed in needle, then inserted full length of needle and withdrawn per handbook instructions. Nexplanon was able to palpated in the patient's arm; patient palpated the insert herself. There was minimal blood loss.  Patient insertion site covered with guaze and a pressure bandage to reduce any bruising.  The patient tolerated the procedure well and was given post procedure instructions.    Assessment:    Normal postpartum exam.   Plan:    1. Contraception: Nexplanon 2. Follow up in: 1 year or as needed.

## 2021-03-25 IMAGING — US US MFM OB FOLLOW-UP
1 series · 14 of 28 positions shown · non-contrast
Comparison: none

[Series 1: us mfm ob follow-up · 14 of 68 slices shown]
[im 3/68]
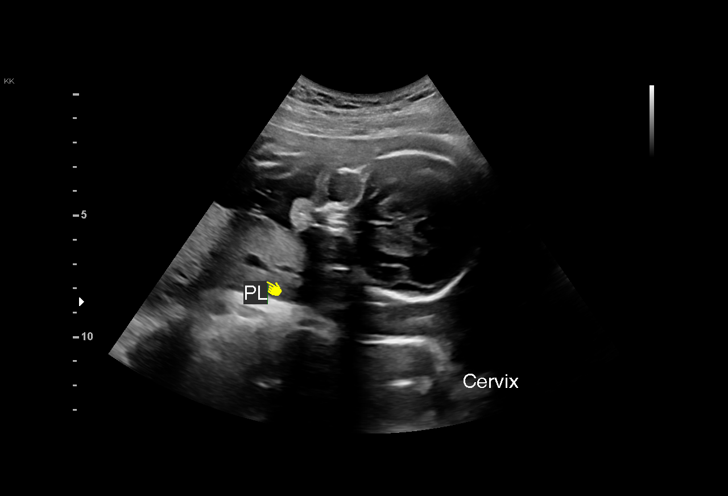
[im 8/68]
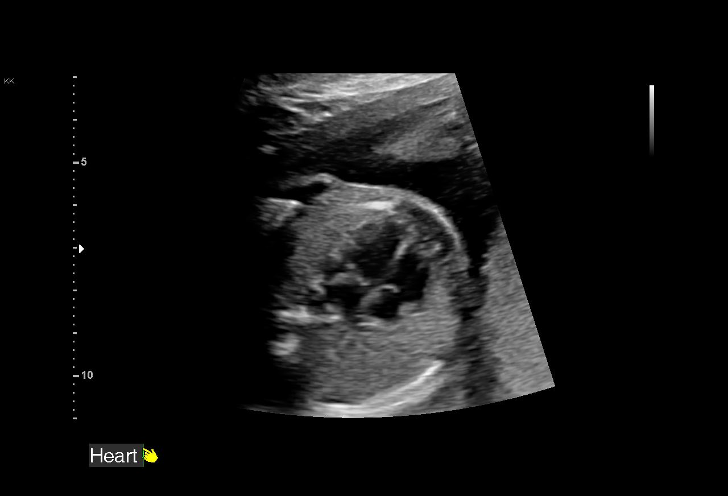
[im 13/68]
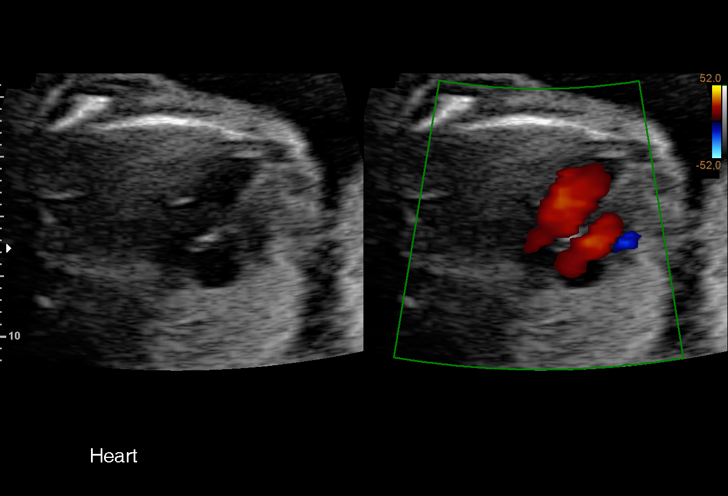
[im 18/68]
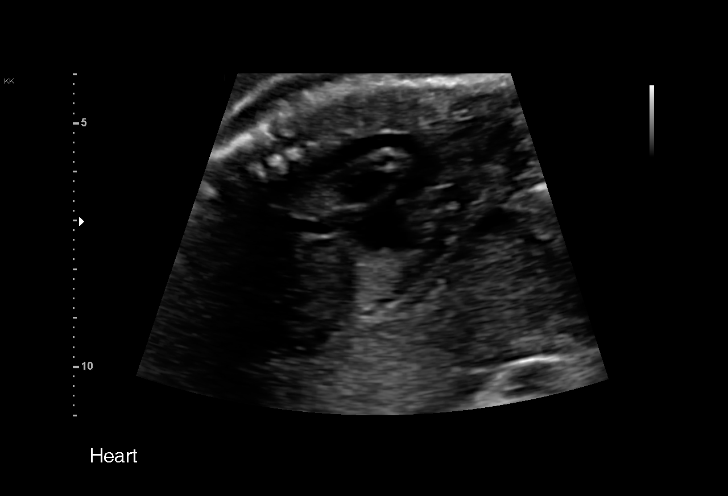
[im 23/68]
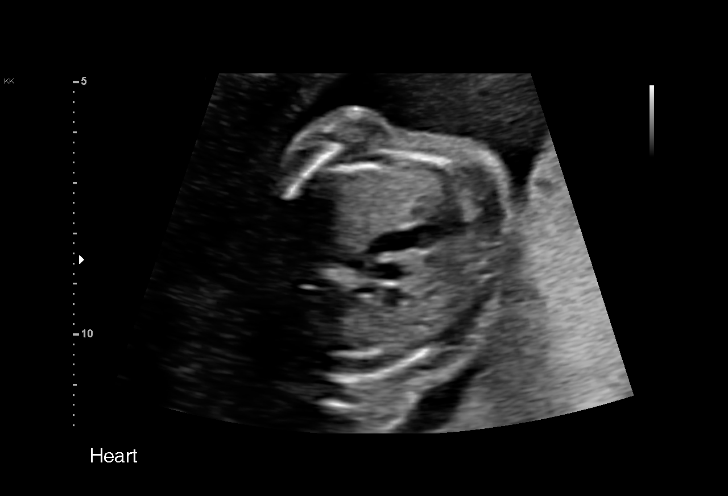
[im 28/68]
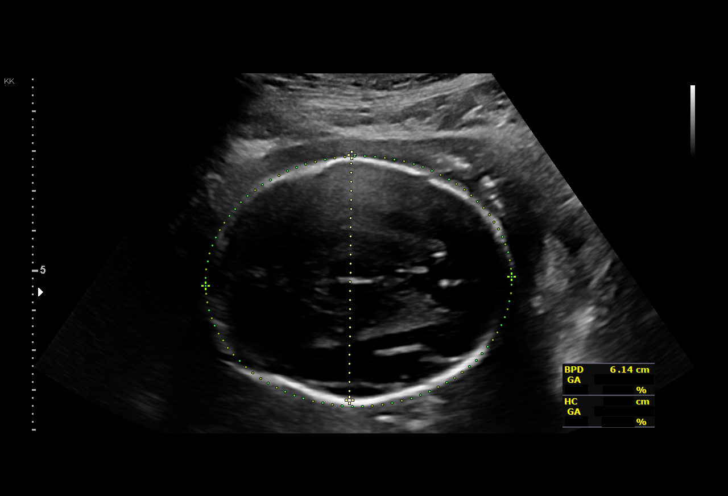
[im 33/68]
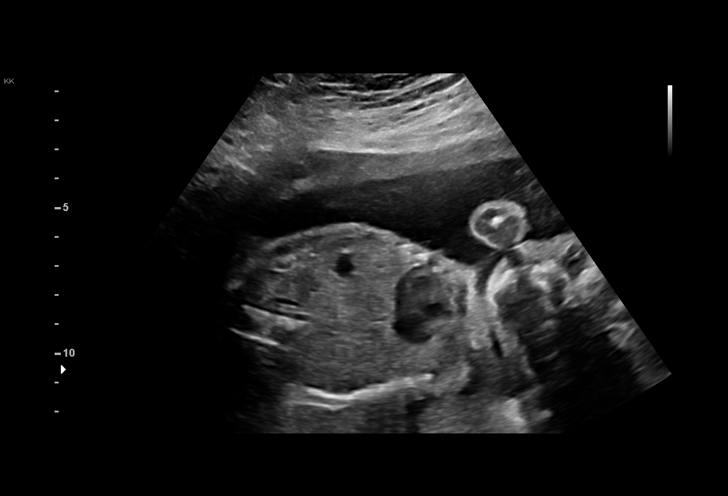
[im 38/68]
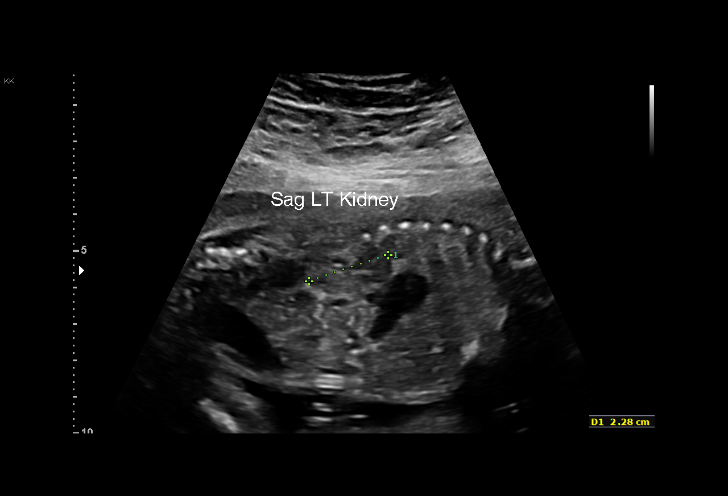
[im 43/68]
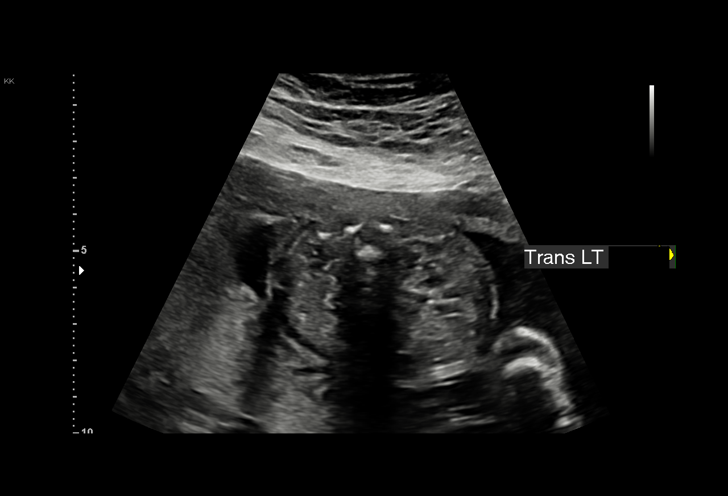
[im 48/68]
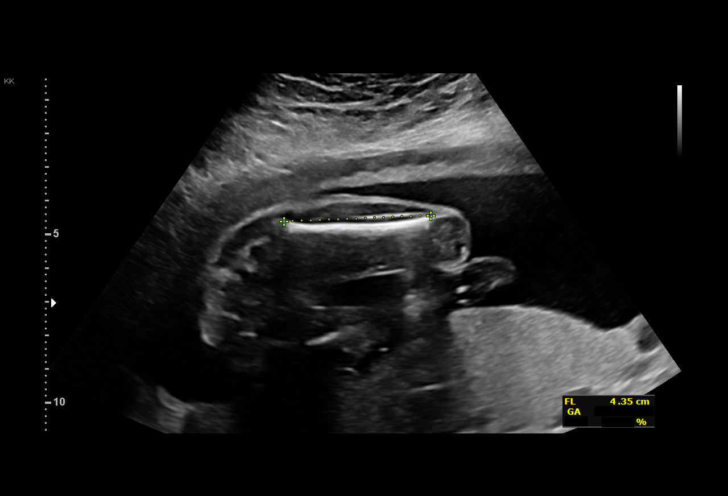
[im 53/68]
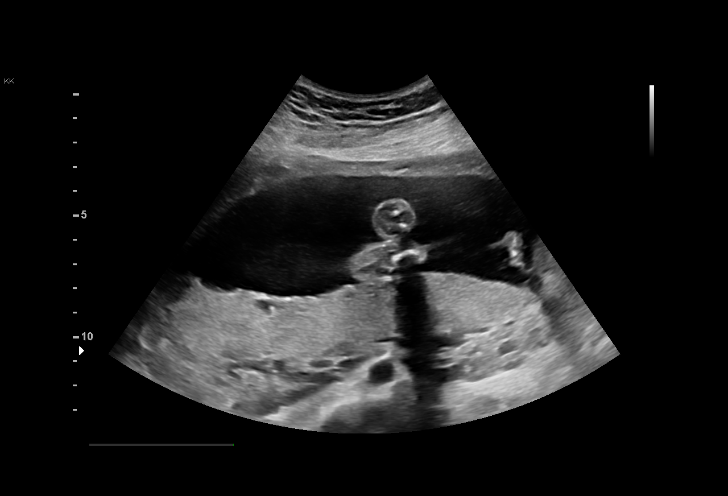
[im 58/68]
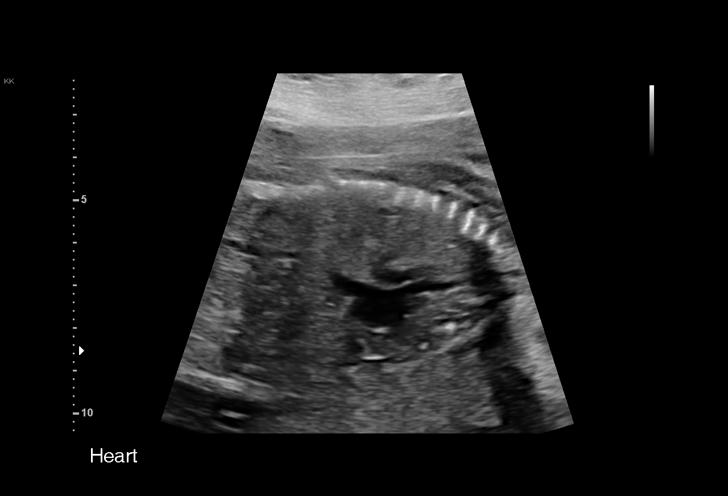
[im 63/68]
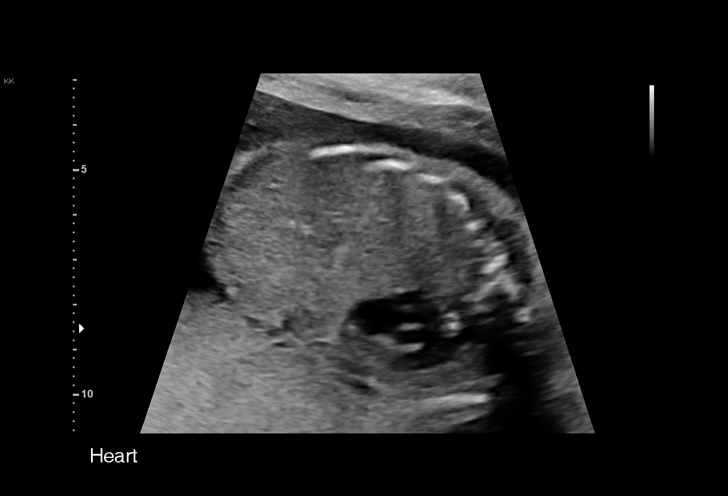
[im 68/68]
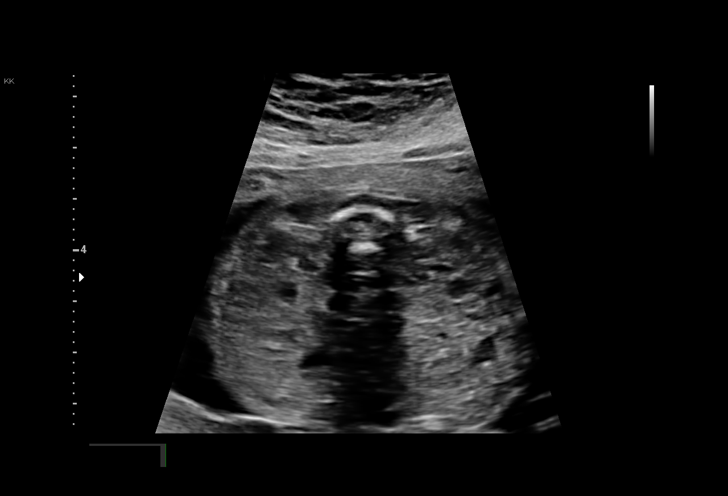

[14 of 28 positions shown; findings below may reference images not displayed]

Indications

 Encounter for other antenatal screening
 follow-up
 Negative Horizon
 23 weeks gestation of pregnancy
Fetal Evaluation

 Num Of Fetuses:          1
 Fetal Heart Rate(bpm):   127
 Cardiac Activity:        Observed
 Presentation:            Cephalic
 Placenta:                Posterior
 P. Cord Insertion:       Previously Visualized

 Amniotic Fluid
 AFI FV:      Within normal limits

                             Largest Pocket(cm)

Biometry

 BPD:      61.1   mm     G. Age:  24w 6d         86  %    CI:         77.31  %    70 - 86
                                                          FL/HC:       19.7  %    18.7 -
 HC:        220   mm     G. Age:  24w 0d         52  %    HC/AC:       1.08       1.05 -
 AC:      203.9   mm     G. Age:  25w 0d         83  %    FL/BPD:      71.0  %    71 - 87
 FL:       43.4   mm     G. Age:  24w 2d         60  %    FL/AC:       21.3  %    20 - 24

 Est. FW:     715   gm      1 lb 9 oz     87  %
OB History

 Blood Type:    O+
 Gravidity:     2         Term:  1          Prem:  0        SAB:   0
 TOP:           0       Ectopic: 0         Living: 1
Gestational Age

 LMP:            23w 4d       Date:  09/27/19                   EDD:  07/03/20
 U/S Today:      24w 4d                                         EDD:  06/26/20
 Best:           23w 4d    Det. By:  LMP  (09/27/19)            EDD:  07/03/20
Anatomy

 Cranium:                Appears normal         LVOT:                   Not well visualized
 Cavum:                  Appears normal         Aortic Arch:            Appears normal
 Ventricles:             Appears normal         Ductal Arch:            Not well visualized
 Choroid Plexus:         Previously seen        Diaphragm:              Appears normal
 Cerebellum:             Previously seen        Stomach:                Appears normal, left
                                                                        sided
 Posterior Fossa:        Previously seen        Abdomen:                Appears normal
 Nuchal Fold:            Previously seen        Abdominal Wall:         Previously seen
 Face:                   Orbits and profile     Cord Vessels:           Previously seen
                         previously seen
 Lips:                   Appears normal         Kidneys:                Appear normal
 Palate:                 Not well visualized    Bladder:                Appears normal
 Thoracic:               Appears normal         Spine:                  Previously seen
 Heart:                  Appears normal         Upper Extremities:      Previously seen
                         (4CH, axis, and situs)
 RVOT:                   Appears normal         Lower Extremities:      Previously seen

 Other:   Female gender Nasal bone, Heels, Left 5th digit, Right Open hand
          previously visualized. Technically difficult due to fetal position.
Cervix Uterus Adnexa

 Cervix
 Not visualized (advanced GA >93wks)
Impression

 Follow up anatomy with measurements consistent with dates
 Good fetal fetal movement and amniotic fluid
 Suboptimal heart views again seen given fetal position.
Recommendations

 Follow up anatomy and growth is scheduled in 4 weeks.

## 2021-10-07 DIAGNOSIS — Z13220 Encounter for screening for lipoid disorders: Secondary | ICD-10-CM | POA: Diagnosis not present

## 2021-10-07 DIAGNOSIS — Z975 Presence of (intrauterine) contraceptive device: Secondary | ICD-10-CM | POA: Diagnosis not present

## 2021-10-07 DIAGNOSIS — Z0000 Encounter for general adult medical examination without abnormal findings: Secondary | ICD-10-CM | POA: Diagnosis not present

## 2021-10-07 DIAGNOSIS — Z23 Encounter for immunization: Secondary | ICD-10-CM | POA: Diagnosis not present

## 2021-10-07 DIAGNOSIS — F321 Major depressive disorder, single episode, moderate: Secondary | ICD-10-CM | POA: Diagnosis not present

## 2021-11-18 DIAGNOSIS — Z3046 Encounter for surveillance of implantable subdermal contraceptive: Secondary | ICD-10-CM | POA: Diagnosis not present

## 2021-11-18 DIAGNOSIS — Z113 Encounter for screening for infections with a predominantly sexual mode of transmission: Secondary | ICD-10-CM | POA: Diagnosis not present

## 2021-11-18 DIAGNOSIS — R87618 Other abnormal cytological findings on specimens from cervix uteri: Secondary | ICD-10-CM | POA: Diagnosis not present

## 2021-11-18 DIAGNOSIS — Z01419 Encounter for gynecological examination (general) (routine) without abnormal findings: Secondary | ICD-10-CM | POA: Diagnosis not present

## 2021-11-18 DIAGNOSIS — N760 Acute vaginitis: Secondary | ICD-10-CM | POA: Diagnosis not present

## 2021-11-29 DIAGNOSIS — Z3046 Encounter for surveillance of implantable subdermal contraceptive: Secondary | ICD-10-CM | POA: Diagnosis not present

## 2022-10-24 ENCOUNTER — Encounter (HOSPITAL_COMMUNITY): Payer: Self-pay

## 2022-10-24 ENCOUNTER — Ambulatory Visit (HOSPITAL_COMMUNITY)
Admission: EM | Admit: 2022-10-24 | Discharge: 2022-10-24 | Disposition: A | Discharge status: Home / Self Care | Payer: Medicaid Other | Attending: Internal Medicine | Admitting: Internal Medicine

## 2022-10-24 DIAGNOSIS — R590 Localized enlarged lymph nodes: Secondary | ICD-10-CM | POA: Diagnosis not present

## 2022-10-24 MED ORDER — AMOXICILLIN-POT CLAVULANATE 875-125 MG PO TABS: 1.0000 | ORAL_TABLET | Freq: Two times a day (BID) | ORAL | 0 refills | Status: AC

## 2022-10-24 NOTE — ED Provider Notes (Signed)
[place]    CSN: [id number] Arrival date & time: 10/24/22  1011      History   Chief Complaint Chief Complaint  Patient presents with   Lymphadenopathy    HPI Jeanette Becker is a 32 y.o. female.   Patient presents urgent care for evaluation of submental swelling/nodule that started last week on Tuesday, October 18, 2022 (6 days ago).  She states the swelling was very small at first but has grown in size and tenderness over the last 6 days.  She was sick with a viral illness last week with bodyaches, fever/chills, nasal congestion, cough, and sore throat but states this has improved significantly and symptoms have resolved other than the submental swelling.  She is unsure if this is a lymph node.  Denies recent antibiotic or steroid use.  She does not have any asthma or history of chronic respiratory problems.  She has not had recent fever/chills in the last few days, weight loss without trying, or night sweats.  She has never had this happen in the past.  Has been taking ibuprofen to help with the pain with some relief.  Area of swelling is not red or warm and feels tender and firm to touch.  Denies drainage from the area.  No rash, persistent sore throat, ear pain, tinnitus, or headache.  Denies recent dental work but does report she is starting to experience dental pain and sore throat related to swelling and pain under the chin.  She is not a smoker and denies drug use.  No recent traumas or injuries to the jaw/submental region.     Past Medical History:  Diagnosis Date   Medical history non-contributory     Patient Active Problem List   Diagnosis Date Noted   PROM (premature rupture of membranes) 06/27/2020   COVID-19 affecting pregnancy, antepartum 06/17/2020   [redacted] weeks gestation of pregnancy 06/09/2020   Chlamydia infection affecting pregnancy in first trimester 01/27/2020   Supervision of other normal pregnancy, antepartum 12/09/2019   LGSIL on Pap  smear of cervix 01/04/2018    Past Surgical History:  Procedure Laterality Date   NO PAST SURGERIES      OB History     Gravida  2   Para  2   Term  2   Preterm      AB      Living  2      SAB      IAB      Ectopic      Multiple  0   Live Births  2            Home Medications    Prior to Admission medications   Medication Sig Start Date End Date Taking? Authorizing Provider  amoxicillin-clavulanate (AUGMENTIN) 875-125 MG tablet Take 1 tablet by mouth every 12 (twelve) hours. 10/24/22  Yes Talbot Grumbling, FNP  ibuprofen (ADVIL) 600 MG tablet Take 1 tablet (600 mg total) by mouth every 6 (six) hours. Patient not taking: Reported on 09/22/2020 06/29/20   Cresenzo-Dishmon, Joaquim Lai, CNM  norethindrone (MICRONOR) 0.35 MG tablet Take 1 tablet (0.35 mg total) by mouth daily. Start the Sunday after the baby turns 10 weeks old Patient not taking: Reported on 09/22/2020 06/29/20   Christin Fudge, CNM  Prenatal Vit-Fe Phos-FA-Omega (VITAFOL GUMMIES) 3.33-0.333-34.8 MG CHEW Chew 3 each by mouth daily. Patient not taking: Reported on 09/22/2020 12/16/19   Virginia Rochester, NP    Family History History reviewed. No  pertinent family history.  Social History Social History   Tobacco Use   Smoking status: Never   Smokeless tobacco: Never  Vaping Use   Vaping Use: Never used  Substance Use Topics   Alcohol use: No   Drug use: No     Allergies   Patient has no known allergies.   Review of Systems Review of Systems Per HPI  Physical Exam Triage Vital Signs ED Triage Vitals  Enc Vitals Group     BP 10/24/22 1051 122/82     Pulse Rate 10/24/22 1051 93     Resp 10/24/22 1051 12     Temp 10/24/22 1051 98.2 F (36.8 C)     Temp Source 10/24/22 1051 Oral     SpO2 10/24/22 1051 98 %     Weight --      Height --      Head Circumference --      Peak Flow --      Pain Score 10/24/22 1054 3     Pain Loc --      Pain Edu? --      Excl. in [place]?  --    No data found.  Updated Vital Signs BP 122/82 (BP Location: Left Wrist)   Pulse 93   Temp 98.2 F (36.8 C) (Oral)   Resp 12   SpO2 98%   Breastfeeding No   Visual Acuity Right Eye Distance:   Left Eye Distance:   Bilateral Distance:    Right Eye Near:   Left Eye Near:    Bilateral Near:     Physical Exam Vitals and nursing note reviewed.  Constitutional:      Appearance: She is not ill-appearing or toxic-appearing.  HENT:     Head: Normocephalic and atraumatic.     Jaw: There is normal jaw occlusion.     Right Ear: Hearing and external ear normal.     Left Ear: Hearing and external ear normal.     Nose: Nose normal.     Mouth/Throat:     Lips: Pink.  Eyes:     General: Lids are normal. Vision grossly intact. Gaze aligned appropriately.     Extraocular Movements: Extraocular movements intact.     Conjunctiva/sclera: Conjunctivae normal.  Cardiovascular:     Rate and Rhythm: Normal rate and regular rhythm.     Heart sounds: Normal heart sounds, S1 normal and S2 normal.  Pulmonary:     Effort: Pulmonary effort is normal. No respiratory distress.     Breath sounds: Normal breath sounds and air entry.  Musculoskeletal:     Cervical back: Neck supple.  Lymphadenopathy:     Head:     Right side of head: Submental adenopathy present. No submandibular, preauricular or posterior auricular adenopathy.     Left side of head: No submental, submandibular, preauricular or posterior auricular adenopathy.     Cervical: No cervical adenopathy.     Comments: Approximately 2 cm in diameter cyst/swollen lymph node that is movable and tender to palpation present to the right submental region.  Nodule is nonfluctuant and is firm to palpation.  No drainage, erythema, or warmth to area.  Skin:    General: Skin is warm and dry.     Capillary Refill: Capillary refill takes less than 2 seconds.     Findings: No rash.  Neurological:     General: No focal deficit present.     Mental  Status: She is alert and oriented to person, place,  and time. Mental status is at baseline.     Cranial Nerves: No dysarthria or facial asymmetry.  Psychiatric:        Mood and Affect: Mood normal.        Speech: Speech normal.        Behavior: Behavior normal.        Thought Content: Thought content normal.        Judgment: Judgment normal.      UC Treatments / Results  Labs (all labs ordered are listed, but only abnormal results are displayed) Labs Reviewed - No data to display  EKG   Radiology No results found.  Procedures Procedures (including critical care time)  Medications Ordered in UC Medications - No data to display  Initial Impression / Assessment and Plan / UC Course  I have reviewed the triage vital signs and the nursing notes.  Pertinent labs & imaging results that were available during my care of the patient were reviewed by me and considered in my medical decision making (see chart for details).   1.  Submental lymphadenopathy Unclear etiology of patient's symptoms, although likely submental lymph node swelling due to recent viral illness.  Patient describes the area as becoming progressively worse and larger/more tender by the day, therefore we will use Augmentin antibiotic twice daily for the next 7 days to treat for possible infected submental cyst.  She is to schedule an appointment with her PCP to follow-up to ensure that her symptoms have improved using the Augmentin.  Her HEENT exam is stable.  Warm compresses to the area of swelling recommended.  Strict ER and urgent care return precautions discussed.  Patient is agreeable with plan.  Discussed physical exam and available lab work findings in clinic with patient.  Counseled patient regarding appropriate use of medications and potential side effects for all medications recommended or prescribed today. Discussed red flag signs and symptoms of worsening condition,when to call the PCP office, return to urgent  care, and when to seek higher level of care in the emergency department. Patient verbalizes understanding and agreement with plan. All questions answered. Patient discharged in stable condition.    Final Clinical Impressions(s) / UC Diagnoses   Final diagnoses:  Submental lymphadenopathy     Discharge Instructions      Take augmentin antibiotic 2 times a day for the next 7 days to treat possible cyst versus swollen lymphnode to the chin.   Use warm compresses to the swollen nodule under the chin to reduce infection.  You may continue taking ibuprofen 600 mg every 6 hours as needed for pain and swelling to the area.  Schedule an appointment with your primary care provider for follow-up to make sure that this is improving.  If you develop any new or worsening symptoms or do not improve in the next 2 to 3 days, please return.  If your symptoms are severe, please go to the emergency room.  Follow-up with your primary care provider for further evaluation and management of your symptoms as well as ongoing wellness visits.  I hope you feel better!     ED Prescriptions     Medication Sig Dispense Auth. Provider   amoxicillin-clavulanate (AUGMENTIN) 875-125 MG tablet Take 1 tablet by mouth every 12 (twelve) hours. 14 tablet Talbot Grumbling, FNP      PDMP not reviewed this encounter.   Talbot Grumbling, [place] 10/24/22 [phone]

## 2022-10-24 NOTE — ED Triage Notes (Signed)
Pt is here for swollen gland sensitive to tough on the right side of neck x 1wk

## 2022-10-24 NOTE — Discharge Instructions (Addendum)
Take augmentin antibiotic 2 times a day for the next 7 days to treat possible cyst versus swollen lymphnode to the chin.   Use warm compresses to the swollen nodule under the chin to reduce infection.  You may continue taking ibuprofen 600 mg every 6 hours as needed for pain and swelling to the area.  Schedule an appointment with your primary care provider for follow-up to make sure that this is improving.  If you develop any new or worsening symptoms or do not improve in the next 2 to 3 days, please return.  If your symptoms are severe, please go to the emergency room.  Follow-up with your primary care provider for further evaluation and management of your symptoms as well as ongoing wellness visits.  I hope you feel better!

## 2022-11-10 DIAGNOSIS — E7800 Pure hypercholesterolemia, unspecified: Secondary | ICD-10-CM | POA: Diagnosis not present

## 2022-11-10 DIAGNOSIS — Z0000 Encounter for general adult medical examination without abnormal findings: Secondary | ICD-10-CM | POA: Diagnosis not present

## 2022-11-10 DIAGNOSIS — R635 Abnormal weight gain: Secondary | ICD-10-CM | POA: Diagnosis not present

## 2023-03-14 DIAGNOSIS — Z114 Encounter for screening for human immunodeficiency virus [HIV]: Secondary | ICD-10-CM | POA: Diagnosis not present

## 2023-03-14 DIAGNOSIS — Z113 Encounter for screening for infections with a predominantly sexual mode of transmission: Secondary | ICD-10-CM | POA: Diagnosis not present

## 2023-03-14 DIAGNOSIS — N898 Other specified noninflammatory disorders of vagina: Secondary | ICD-10-CM | POA: Diagnosis not present

## 2023-03-19 ENCOUNTER — Emergency Department (HOSPITAL_COMMUNITY)
Admission: EM | Admit: 2023-03-19 | Discharge: 2023-03-19 | Disposition: A | Discharge status: Home / Self Care | Payer: BC Managed Care – PPO | Attending: Emergency Medicine | Admitting: Emergency Medicine

## 2023-03-19 ENCOUNTER — Encounter (HOSPITAL_COMMUNITY): Payer: Self-pay

## 2023-03-19 ENCOUNTER — Emergency Department (HOSPITAL_COMMUNITY): Payer: BC Managed Care – PPO

## 2023-03-19 DIAGNOSIS — X58XXXA Exposure to other specified factors, initial encounter: Secondary | ICD-10-CM | POA: Insufficient documentation

## 2023-03-19 DIAGNOSIS — S60211A Contusion of right wrist, initial encounter: Secondary | ICD-10-CM | POA: Insufficient documentation

## 2023-03-19 DIAGNOSIS — M79641 Pain in right hand: Secondary | ICD-10-CM | POA: Diagnosis not present

## 2023-03-19 MED ORDER — IBUPROFEN 600 MG PO TABS: 600.0000 mg | ORAL_TABLET | Freq: Three times a day (TID) | ORAL | 0 refills | Status: AC | PRN

## 2023-03-19 NOTE — ED Triage Notes (Signed)
Pt c/o pain in her right hand/wrist. Pt states she was doing laundry when this started.

## 2023-03-19 NOTE — ED Provider Notes (Signed)
Jean[facility] EMERGENCY DEPARTMENT AT [facility] Provider Note   CSN: [id number] Arrival date & time: 03/19/23  1601     History  Chief Complaint  Patient presents with   Hand Pain    Jeanette Becker is a 32 y.o. female.  The history is provided by the patient and medical records. No language interpreter was used.  Hand Pain     32 year old female presenting today with complaints of right hand pain.  Patient reported earlier today as she was doing laundry she noticed that her pain in her right wrist was raised.  She tries to massage it and then it turns into a bruise follows with pain.  Pain is sharp and achy mild to moderate but it concerns her.  She does not endorse any numbness or weakness she denies any specific injury.  No fever or chills.  No history of abnormal bleeding.  She is right-hand dominant and does do a lot of repetitive movement.  Home Medications Prior to Admission medications   Medication Sig Start Date End Date Taking? Authorizing Provider  amoxicillin-clavulanate (AUGMENTIN) 875-125 MG tablet Take 1 tablet by mouth every 12 (twelve) hours. 10/24/22   Carlisle Beers, FNP  ibuprofen (ADVIL) 600 MG tablet Take 1 tablet (600 mg total) by mouth every 6 (six) hours. Patient not taking: Reported on 09/22/2020 06/29/20   Cresenzo-Dishmon, Scarlette Calico, CNM  norethindrone (MICRONOR) 0.35 MG tablet Take 1 tablet (0.35 mg total) by mouth daily. Start the Sunday after the baby turns 34 weeks old Patient not taking: Reported on 09/22/2020 06/29/20   Jacklyn Shell, CNM  Prenatal Vit-Fe Phos-FA-Omega (VITAFOL GUMMIES) 3.33-0.333-34.8 MG CHEW Chew 3 each by mouth daily. Patient not taking: Reported on 09/22/2020 12/16/19   Currie Paris, NP      Allergies    Patient has no known allergies.    Review of Systems   Review of Systems  All other systems reviewed and are negative.   Physical Exam Updated Vital Signs BP (!) 98/55 (BP Location: Left  Arm)   Pulse 74   Temp 99.4 F (37.4 C)   Resp 17   Ht 5' (1.524 m)   Wt 79.4 kg   LMP 03/03/2023 Comment: pt states NCP  SpO2 98%   BMI 34.18 kg/m  Physical Exam Vitals and nursing note reviewed.  Constitutional:      General: She is not in acute distress.    Appearance: She is well-developed.  HENT:     Head: Atraumatic.  Eyes:     Conjunctiva/sclera: Conjunctivae normal.  Pulmonary:     Effort: Pulmonary effort is normal.  Musculoskeletal:        General: Tenderness (Right upper extremity: There is a small ecchymosis noted to the volar aspect of her right wrist with mild tenderness to palpation but no erythema, edema, or warmth noted.  Wrist with full range of motion, right radial pulse 2+, brisk cap refills to fingers) present.     Cervical back: Neck supple.  Skin:    Findings: No rash.  Neurological:     Mental Status: She is alert.  Psychiatric:        Mood and Affect: Mood normal.     ED Results / Procedures / Treatments   Labs (all labs ordered are listed, but only abnormal results are displayed) Labs Reviewed - No data to display  EKG None  Radiology DG Hand Complete Right  Result Date: 03/19/2023 CLINICAL DATA:  Hand pain. EXAM: RIGHT  HAND - COMPLETE 3+ VIEW COMPARISON:  None Available. FINDINGS: Normal bone mineralization. 1.5 mm ulnar positive variance. Joint spaces are preserved. No acute fracture or dislocation. The cortices are intact. IMPRESSION: Normal right hand radiographs. Electronically Signed   By: Neita Garnet M.D.   On: 03/19/2023 17:01    Procedures Procedures    Medications Ordered in ED Medications - No data to display  ED Course/ Medical Decision Making/ A&P                             Medical Decision Making Amount and/or Complexity of Data Reviewed Radiology: ordered.   BP (!) 98/55 (BP Location: Left Arm)   Pulse 74   Temp 99.4 F (37.4 C)   Resp 17   Ht 5' (1.524 m)   Wt 79.4 kg   LMP 03/03/2023 Comment: pt states  NCP  SpO2 98%   BMI 34.18 kg/m   37:66 PM  32 year old female presenting today with complaints of right hand pain.  Patient reported earlier today as she was doing laundry she noticed that her pain in her right wrist was raised.  She tries to massage it and then it turns into a bruise follows with pain.  Pain is sharp and achy mild to moderate but it concerns her.  She does not endorse any numbness or weakness she denies any specific injury.  No fever or chills.  No history of abnormal bleeding.  She is right-hand dominant and does do a lot of repetitive movement.  On exam patient has a small ecchymosis noted to the volar aspect of her right wrist mildly tender to palpitation.  She is neurovascular intact no evidence of infection noted no signs of deformity and there is no edema.  X-ray right hand obtained independently viewed interpreted by me and overall reassuring.  DDx: contusion, strain, sprain, arthritis, carpal tunnel syndrome, fracture, dislocation, cellulitis, septic joint  At this time no concerning findings were noted and, reassurance given recommend RICE therapy and outpatient follow-up given.  Return precaution discussed.  Ace wrap to right wrist for compression and support was provided in the ED with improvement of symptom.         Final Clinical Impression(s) / ED Diagnoses Final diagnoses:  Contusion of right wrist, initial encounter    Rx / DC Orders ED Discharge Orders          Ordered    ibuprofen (ADVIL) 600 MG tablet  Every 8 hours PRN        03/19/23 1908              Fayrene Helper, PA-C 03/19/23 Angela Cox, MD 03/19/23 2255

## 2023-03-19 NOTE — ED Notes (Signed)
ACE wrap applied to R wrist.  Patient verbalizes understanding of discharge instructions. Opportunity for questioning and answers were provided. Armband removed by staff, pt discharged from ED to home.

## 2023-06-12 DIAGNOSIS — Z131 Encounter for screening for diabetes mellitus: Secondary | ICD-10-CM | POA: Diagnosis not present

## 2023-06-12 DIAGNOSIS — E669 Obesity, unspecified: Secondary | ICD-10-CM | POA: Diagnosis not present

## 2023-06-19 DIAGNOSIS — E669 Obesity, unspecified: Secondary | ICD-10-CM | POA: Diagnosis not present

## 2023-07-21 DIAGNOSIS — E669 Obesity, unspecified: Secondary | ICD-10-CM | POA: Diagnosis not present

## 2023-11-28 DIAGNOSIS — Z113 Encounter for screening for infections with a predominantly sexual mode of transmission: Secondary | ICD-10-CM | POA: Diagnosis not present

## 2023-11-28 DIAGNOSIS — R7303 Prediabetes: Secondary | ICD-10-CM | POA: Diagnosis not present

## 2023-11-28 DIAGNOSIS — Z0000 Encounter for general adult medical examination without abnormal findings: Secondary | ICD-10-CM | POA: Diagnosis not present

## 2023-12-19 DIAGNOSIS — Z01419 Encounter for gynecological examination (general) (routine) without abnormal findings: Secondary | ICD-10-CM | POA: Diagnosis not present

## 2023-12-19 DIAGNOSIS — N898 Other specified noninflammatory disorders of vagina: Secondary | ICD-10-CM | POA: Diagnosis not present

## 2024-01-02 DIAGNOSIS — F411 Generalized anxiety disorder: Secondary | ICD-10-CM | POA: Diagnosis not present

## 2024-02-29 DIAGNOSIS — E669 Obesity, unspecified: Secondary | ICD-10-CM | POA: Diagnosis not present
